# Patient Record
Sex: Female | Born: 1980 | Race: White | Hispanic: No | Marital: Married | Smoking: Never smoker
Health system: Southern US, Community
[De-identification: ages and names within clinical notes are randomized; demographics above are authoritative.]

## PROBLEM LIST (undated history)

## (undated) DIAGNOSIS — J069 Acute upper respiratory infection, unspecified: Secondary | ICD-10-CM

## (undated) DIAGNOSIS — J45909 Unspecified asthma, uncomplicated: Secondary | ICD-10-CM

## (undated) DIAGNOSIS — L508 Other urticaria: Secondary | ICD-10-CM

## (undated) DIAGNOSIS — T7840XA Allergy, unspecified, initial encounter: Secondary | ICD-10-CM

## (undated) DIAGNOSIS — K219 Gastro-esophageal reflux disease without esophagitis: Secondary | ICD-10-CM

## (undated) HISTORY — DX: Allergy, unspecified, initial encounter: T78.40XA

## (undated) HISTORY — PX: MANDIBLE SURGERY: SHX707

## (undated) HISTORY — DX: Unspecified asthma, uncomplicated: J45.909

## (undated) HISTORY — DX: Other urticaria: L50.8

## (undated) HISTORY — DX: Acute upper respiratory infection, unspecified: J06.9

## (undated) HISTORY — PX: CHOLECYSTECTOMY: SHX55

---

## 2003-07-13 ENCOUNTER — Emergency Department (HOSPITAL_COMMUNITY): Admission: EM | Admit: 2003-07-13 | Discharge: 2003-07-14 | Payer: Self-pay | Admitting: *Deleted

## 2003-10-23 ENCOUNTER — Other Ambulatory Visit: Admission: RE | Admit: 2003-10-23 | Discharge: 2003-10-23 | Payer: Self-pay | Admitting: Obstetrics and Gynecology

## 2004-04-14 ENCOUNTER — Inpatient Hospital Stay (HOSPITAL_COMMUNITY): Admission: AD | Admit: 2004-04-14 | Discharge: 2004-04-17 | Payer: Self-pay | Admitting: Obstetrics & Gynecology

## 2012-07-29 ENCOUNTER — Encounter: Payer: Self-pay | Admitting: Nurse Practitioner

## 2012-07-29 ENCOUNTER — Ambulatory Visit (INDEPENDENT_AMBULATORY_CARE_PROVIDER_SITE_OTHER): Payer: BC Managed Care – PPO | Admitting: Nurse Practitioner

## 2012-07-29 VITALS — BP 122/77 | Temp 97.6°F | Wt 218.6 lb

## 2012-07-29 DIAGNOSIS — J069 Acute upper respiratory infection, unspecified: Secondary | ICD-10-CM

## 2012-07-29 MED ORDER — CEFPROZIL 500 MG PO TABS: 500.0000 mg | ORAL_TABLET | Freq: Two times a day (BID) | ORAL | Status: DC

## 2012-07-29 MED ORDER — HYDROCODONE-HOMATROPINE 5-1.5 MG/5ML PO SYRP: 5.0000 mL | ORAL_SOLUTION | ORAL | Status: DC | PRN

## 2012-08-01 ENCOUNTER — Encounter: Payer: Self-pay | Admitting: Nurse Practitioner

## 2012-08-01 NOTE — Progress Notes (Signed)
Subjective:  Presents complaints of sinus symptoms over the past 3 days. Low-grade fever. Frequent nonproductive cough. Facial area headache. Green nasal drainage. Occasional posttussive vomiting. No wheezing. Slight sore throat. No ear pain. Taking fluids well. No relief with OTC meds.  Objective:   BP 122/77  Temp(Src) 97.6 F (36.4 C)  Wt 218 lb 9.6 oz (99.156 kg)  LMP 07/29/2012 NAD. Alert, oriented. TMs clear effusion, no erythema. Pharynx erythematous with green PND noted. Neck supple with mild soft adenopathy. Lungs clear. Heart regular rate rhythm.  Assessment:Acute upper respiratory infection  Plan: Cefzil and hydrocodone as directed. OTC meds for daytime use. Call back next week if no improvement symptoms, sooner if worse.

## 2012-12-15 ENCOUNTER — Telehealth: Payer: Self-pay | Admitting: Family Medicine

## 2012-12-15 ENCOUNTER — Other Ambulatory Visit: Payer: Self-pay | Admitting: *Deleted

## 2012-12-15 MED ORDER — AMOXICILLIN 500 MG PO TABS: 500.0000 mg | ORAL_TABLET | Freq: Three times a day (TID) | ORAL | Status: DC

## 2012-12-15 MED ORDER — FLUCONAZOLE 150 MG PO TABS: 150.0000 mg | ORAL_TABLET | Freq: Once | ORAL | Status: DC

## 2012-12-15 NOTE — Telephone Encounter (Signed)
If not allergic to PCN then amoxil 500 one tid 10 days, f-u if ongoing

## 2012-12-15 NOTE — Telephone Encounter (Signed)
Discussed with patient med sent to pharm. Pt also wanted to know if she could get diflucan bc she also gets yeast infection when taking antibiotic. Pt does not need a call back if you agree to give. Just send to [facility].

## 2012-12-15 NOTE — Telephone Encounter (Signed)
Patients son was diagnosed with strep throat-Patient is now having throat pain, and headache and was told in OV with her son that we could call something in if she showed symptoms.     [facility]

## 2012-12-15 NOTE — Telephone Encounter (Signed)
Error-not son, daughter was diagnosed by Eber Jones. Amy Russell is daughter.

## 2012-12-15 NOTE — Telephone Encounter (Signed)
Send diflucan 150, 1 po times one, 2 refills

## 2012-12-15 NOTE — Telephone Encounter (Signed)
Med sent to pharm 

## 2012-12-27 ENCOUNTER — Encounter: Payer: Self-pay | Admitting: Family Medicine

## 2012-12-27 ENCOUNTER — Ambulatory Visit (INDEPENDENT_AMBULATORY_CARE_PROVIDER_SITE_OTHER): Payer: BC Managed Care – PPO | Admitting: Family Medicine

## 2012-12-27 VITALS — BP 132/88 | Temp 97.9°F | Ht 67.5 in | Wt 220.0 lb

## 2012-12-27 DIAGNOSIS — J019 Acute sinusitis, unspecified: Secondary | ICD-10-CM

## 2012-12-27 MED ORDER — LEVOFLOXACIN 500 MG PO TABS: 500.0000 mg | ORAL_TABLET | Freq: Every day | ORAL | Status: AC

## 2012-12-27 NOTE — Progress Notes (Signed)
  Subjective:    Patient ID: Amy Russell, female    DOB: 1980-04-29, 32 y.o.   MRN: [id number]  Cough This is a new problem. The current episode started yesterday. Associated symptoms include headaches, myalgias, postnasal drip and a sore throat. She has tried OTC cough suppressant (tylenol, mucinex, clariton) for the symptoms.   PMH denies any other particular problems currently does not smoke. No allergies.  Review of Systems  HENT: Positive for postnasal drip and sore throat.   Respiratory: Positive for cough.   Musculoskeletal: Positive for myalgias.  Neurological: Positive for headaches.       Objective:   Physical Exam  Moderate sinus tenderness in the maxillary sinuses eardrums normal throat is normal neck is supple lungs are clear no crackles no wheezing. Respiratory rate normal     Assessment & Plan:  Recent pharyngitis-resolving Viral syndrome with secondary sinusitis-Levaquin 10 days as directed

## 2012-12-27 NOTE — Patient Instructions (Signed)
Pseudoephedrine ask at pharmacy ( for cough use Delsym )

## 2013-02-08 ENCOUNTER — Encounter: Payer: Self-pay | Admitting: Family Medicine

## 2013-02-08 ENCOUNTER — Ambulatory Visit (INDEPENDENT_AMBULATORY_CARE_PROVIDER_SITE_OTHER): Payer: BC Managed Care – PPO | Admitting: Family Medicine

## 2013-02-08 VITALS — BP 136/94 | Temp 97.6°F | Ht 67.0 in | Wt 212.8 lb

## 2013-02-08 DIAGNOSIS — J310 Chronic rhinitis: Secondary | ICD-10-CM

## 2013-02-08 DIAGNOSIS — J329 Chronic sinusitis, unspecified: Secondary | ICD-10-CM

## 2013-02-08 MED ORDER — CEFDINIR 300 MG PO CAPS: 300.0000 mg | ORAL_CAPSULE | Freq: Two times a day (BID) | ORAL | Status: DC

## 2013-02-08 NOTE — Progress Notes (Signed)
   Subjective:    Patient ID: Amy Russell, female    DOB: 24-Mar-1980, 32 y.o.   MRN: [id number]  Sinusitis This is a new problem. The current episode started in the past 7 days. There has been no fever. Associated symptoms include congestion, coughing, headaches, sinus pressure and a sore throat. Past treatments include acetaminophen and oral decongestants. The treatment provided mild relief.   Two  thers with colds  Fall allergies  Cold and sinus med s  Bad drainage dy and night,  Felt bad mond night, and tue  Took off tod with school   Review of Systems  HENT: Positive for congestion, sinus pressure and sore throat.   Respiratory: Positive for cough.   Neurological: Positive for headaches.   no vomiting or diarrhea     Objective:   Physical Exam Alert moderate malaise. H&T moderate nasal congestion frontal tenderness. Pharynx erythema neck supple. Lungs clear heart regular in rhythm.       Assessment & Plan:  Impression 1 acute rhinosinusitis plan Omnicef twice a day 10 days. Symptomatic care discussed. Warning signs discussed. WSL

## 2013-02-11 ENCOUNTER — Encounter: Payer: Self-pay | Admitting: *Deleted

## 2013-03-06 ENCOUNTER — Ambulatory Visit: Payer: BC Managed Care – PPO | Admitting: Family Medicine

## 2013-04-11 ENCOUNTER — Encounter: Payer: Self-pay | Admitting: Family Medicine

## 2013-04-11 ENCOUNTER — Ambulatory Visit (INDEPENDENT_AMBULATORY_CARE_PROVIDER_SITE_OTHER): Payer: BC Managed Care – PPO | Admitting: Family Medicine

## 2013-04-11 VITALS — BP 110/70 | Temp 97.6°F | Ht 67.0 in | Wt 214.5 lb

## 2013-04-11 DIAGNOSIS — J310 Chronic rhinitis: Secondary | ICD-10-CM

## 2013-04-11 DIAGNOSIS — J329 Chronic sinusitis, unspecified: Secondary | ICD-10-CM

## 2013-04-11 MED ORDER — AMOXICILLIN-POT CLAVULANATE 875-125 MG PO TABS: 1.0000 | ORAL_TABLET | Freq: Two times a day (BID) | ORAL | Status: AC

## 2013-04-11 NOTE — Progress Notes (Signed)
   Subjective:    Patient ID: Amy Russell, female    DOB: [birth year], 33 y.o.   MRN: [id number]  Fever  This is a new problem. The current episode started in the past 7 days. The problem occurs constantly. The problem has been gradually worsening. The maximum temperature noted was 101 to 101.9 F. Associated symptoms include congestion, coughing, headaches, muscle aches and a sore throat. She has tried NSAIDs for the symptoms. The treatment provided mild relief.   Felt bad thur eve  Got a little better  Sunday started fever chills  Throat hurtng reeally bad  Fever mon felt horible achey Energy level poor  Appetite diminished  Facial pain and tend  Cough non productive  mostly at nght     Review of Systems  Constitutional: Positive for fever.  HENT: Positive for congestion and sore throat.   Respiratory: Positive for cough.   Neurological: Positive for headaches.       Objective:   Physical Exam  Alert moderate malaise. H&T moderate his congestion frontal tenderness pharynx erythematous. Neck supple. Lungs clear heart rare rhythm.      Assessment & Plan:  Impression 1 rhinosinusitis plan Augmentin twice a day 10 days. Since Medicare discussed. Warning signs discussed. WSL

## 2013-04-17 ENCOUNTER — Telehealth: Payer: Self-pay | Admitting: Family Medicine

## 2013-04-17 MED ORDER — LEVOFLOXACIN 500 MG PO TABS: 500.0000 mg | ORAL_TABLET | Freq: Every day | ORAL | Status: DC

## 2013-04-17 NOTE — Telephone Encounter (Signed)
Patient says that Augmentin that she was prescribed last week is causing her vomiting and diarrhea and makes her feel bad. Please advise.    [facility]

## 2013-04-17 NOTE — Telephone Encounter (Signed)
This can be a side effect. Stop Augmentin, may use Levaquin 500 one qd for 10 days

## 2013-04-17 NOTE — Telephone Encounter (Signed)
Discussed with patient to stop augmentin and to start levaquin. rx sent to [facility].

## 2013-07-25 ENCOUNTER — Encounter: Payer: Self-pay | Admitting: Family Medicine

## 2013-07-25 ENCOUNTER — Ambulatory Visit (INDEPENDENT_AMBULATORY_CARE_PROVIDER_SITE_OTHER): Payer: BC Managed Care – PPO | Admitting: Family Medicine

## 2013-07-25 VITALS — BP 122/74 | Temp 98.5°F | Ht 67.5 in | Wt 216.0 lb

## 2013-07-25 DIAGNOSIS — J029 Acute pharyngitis, unspecified: Secondary | ICD-10-CM

## 2013-07-25 DIAGNOSIS — R509 Fever, unspecified: Secondary | ICD-10-CM

## 2013-07-25 MED ORDER — CEFPROZIL 500 MG PO TABS: 500.0000 mg | ORAL_TABLET | Freq: Two times a day (BID) | ORAL | Status: DC

## 2013-07-25 MED ORDER — CEFTRIAXONE SODIUM 1 G IJ SOLR
500.0000 mg | Freq: Once | INTRAMUSCULAR | Status: AC
  Administered 2013-07-25: 500 mg via INTRAMUSCULAR

## 2013-07-25 NOTE — Progress Notes (Signed)
   Subjective:    Patient ID: Amy Russell, female    DOB: 1980-09-12, 33 y.o.   MRN: [id number]  Fever  This is a new problem. The current episode started yesterday. Pertinent negatives include no chest pain, congestion, coughing, ear pain or wheezing. Associated symptoms comments: Sore throat, bodyaches. She has tried acetaminophen and NSAIDs for the symptoms. The treatment provided no relief.    PMH benign  Review of Systems  Constitutional: Positive for fever. Negative for activity change.  HENT: Positive for sinus pressure. Negative for congestion, ear pain and rhinorrhea.   Eyes: Negative for discharge.  Respiratory: Negative for cough, shortness of breath and wheezing.   Cardiovascular: Negative for chest pain.       Objective:   Physical Exam  Nursing note and vitals reviewed. Constitutional: She appears well-developed.  HENT:  Head: Normocephalic.  Nose: Nose normal.  Mouth/Throat: Oropharyngeal exudate present.  Neck: Neck supple.  Cardiovascular: Normal rate and normal heart sounds.   No murmur heard. Pulmonary/Chest: Effort normal and breath sounds normal. She has no wheezes.  Lymphadenopathy:    She has no cervical adenopathy.  Skin: Skin is warm and dry.          Assessment & Plan:  Probable strep throat given the acute onset of fever adenopathy him feeling bad along with exudative pharyngitis IM shot of antibiotics along with antibiotics and followup if ongoing troubles warnings discussed.

## 2013-08-01 ENCOUNTER — Ambulatory Visit (INDEPENDENT_AMBULATORY_CARE_PROVIDER_SITE_OTHER): Payer: BC Managed Care – PPO | Admitting: Family Medicine

## 2013-08-01 ENCOUNTER — Encounter: Payer: Self-pay | Admitting: Family Medicine

## 2013-08-01 VITALS — BP 114/80 | Temp 98.5°F | Ht 67.5 in | Wt 214.0 lb

## 2013-08-01 DIAGNOSIS — J329 Chronic sinusitis, unspecified: Secondary | ICD-10-CM

## 2013-08-01 DIAGNOSIS — J310 Chronic rhinitis: Secondary | ICD-10-CM

## 2013-08-01 DIAGNOSIS — R05 Cough: Secondary | ICD-10-CM

## 2013-08-01 DIAGNOSIS — R062 Wheezing: Secondary | ICD-10-CM

## 2013-08-01 DIAGNOSIS — R059 Cough, unspecified: Secondary | ICD-10-CM

## 2013-08-01 DIAGNOSIS — J209 Acute bronchitis, unspecified: Secondary | ICD-10-CM

## 2013-08-01 MED ORDER — ALBUTEROL SULFATE HFA 108 (90 BASE) MCG/ACT IN AERS: 2.0000 | INHALATION_SPRAY | Freq: Four times a day (QID) | RESPIRATORY_TRACT | Status: DC | PRN

## 2013-08-01 MED ORDER — AZITHROMYCIN 250 MG PO TABS: ORAL_TABLET | ORAL | Status: DC

## 2013-08-01 MED ORDER — HYDROCODONE-HOMATROPINE 5-1.5 MG/5ML PO SYRP: 5.0000 mL | ORAL_SOLUTION | Freq: Four times a day (QID) | ORAL | Status: DC | PRN

## 2013-08-01 NOTE — Patient Instructions (Signed)
Metered Dose Inhaler (No Spacer Used) Inhaled medicines are the basis of asthma treatment and other breathing problems. Inhaled medicine can only be effective if used properly. Good technique assures that the medicine reaches the lungs. Metered dose inhalers (MDIs) are used to deliver a variety of inhaled medicines. These include quick relief or rescue medicines (such as bronchodilators) and controller medicines (such as corticosteroids). The medicine is delivered by pushing down on a metal canister to release a set amount of spray. If you are using different kinds of inhalers, use your quick relief medicine to open the airways 10 15 minutes before using a steroid if instructed to do so by your health care provider. If you are unsure which inhalers to use and the order of using them, ask your health care provider, nurse, or respiratory therapist. HOW TO USE THE INHALER 1. Remove cap from inhaler. 2. If you are using the inhaler for the first time, you will need to prime it. Shake the inhaler for 5 seconds and release four puffs into the air, away from your face. Ask your health care provider or pharmacist if you have questions about priming your inhaler. 3. Shake inhaler for 5 seconds before each breath in (inhalation). 4. Position the inhaler so that the top of the canister faces up. 5. Put your index finger on the top of the medicine canister. Your thumb supports the bottom of the inhaler. 6. Open your mouth. 7. Either place the inhaler between your teeth and place your lips tightly around the mouthpiece, or hold the inhaler 1 2 inches away from your open mouth. If you are unsure of which technique to use, ask your health care provider. 8. Breathe out (exhale) normally and as completely as possible. 9. Press the canister down with the index finger to release the medicine. 10. At the same time as the canister is pressed, inhale deeply and slowly until the lungs are completely filled. This should take  4 6 seconds. Keep your tongue down. 11. Hold the medicine in your lungs for up to 5 10 seconds (10 seconds is best). This helps the medicine get into the small airways of your lungs. 12. Breathe out slowly, through pursed lips. Whistling is an example of pursed lips. 13. Wait at least 1 minute between puffs. Continue with the above steps until you have taken the number of puffs your health care provider has ordered. Do not use the inhaler more than your health care provider directs you to. 14. Replace cap on inhaler. 15. Follow the directions from your health care provider or the inhaler insert for cleaning the inhaler. If you are using a steroid inhaler, rinse your mouth with water after your last puff, gargle, and spit out the water. Do not swallow the water. AVOID:  Inhaling before or after starting the spray of medicine. It takes practice to coordinate your breathing with triggering the spray.  Inhaling through the nose (rather than the mouth) when triggering the spray. HOW TO DETERMINE IF YOUR INHALER IS FULL OR NEARLY EMPTY You cannot know when an inhaler is empty by shaking it. A few inhalers are now being made with dose counters. Ask your health care provider for a prescription that has a dose counter if you feel you need that extra help. If your inhaler does not have a counter, ask your health care provider to help you determine the date you need to refill your inhaler. Write the refill date on a calendar or your inhaler canister.   Refill your inhaler 7 10 days before it runs out. Be sure to keep an adequate supply of medicine. This includes making sure it is not expired, and you have a spare inhaler.  SEEK MEDICAL CARE IF:   Symptoms are only partially relieved with your inhaler.  You are having trouble using your inhaler.  You experience some increase in phlegm. SEEK IMMEDIATE MEDICAL CARE IF:   You feel little or no relief with your inhalers. You are still wheezing and are feeling  shortness of breath or tightness in your chest or both.  You have dizziness, headaches, or fast heart rate.  You have chills, fever, or night sweats.  There is a noticeable increase in phlegm production, or there is blood in the phlegm. Document Released: 12/21/2006 Document Revised: 10/26/2012 Document Reviewed: 08/11/2012 [employer] Patient Information 2014 [employer].  

## 2013-08-01 NOTE — Progress Notes (Signed)
   Subjective:    Patient ID: Amy Russell, female    DOB: 1980/08/31, 33 y.o.   MRN: [id number]  Wheezing  This is a new problem. Associated symptoms include vomiting. Associated symptoms comments: Cough, fever, sore throat. Treatments tried: cefzil, rocephin, ibuprofen.   PMH benign she was seen about 10 days ago for an infection that got better then this got worse   Review of Systems  Respiratory: Positive for wheezing.   Gastrointestinal: Positive for vomiting.       Objective:   Physical Exam  Lungs are clear tight cough noted not in respiratory distress neck supple anterior lymphadenopathy noted sinus nontender eardrums normal throat normal      Assessment & Plan:  Reactive airway her cough is characteristic of asthmatic cough I don't think the patient has asthma feel it's due to the illness don't recommend prednisone at this time I don't feel she has pneumonia I don't recommend chest x-ray or labs. We will go ahead and do the antibiotics along with cough medication for nighttime use only in albuterol use warning signs were discussed.

## 2013-08-02 ENCOUNTER — Encounter: Payer: Self-pay | Admitting: Family Medicine

## 2013-08-14 ENCOUNTER — Encounter: Payer: Self-pay | Admitting: Family Medicine

## 2013-08-14 ENCOUNTER — Ambulatory Visit (INDEPENDENT_AMBULATORY_CARE_PROVIDER_SITE_OTHER): Payer: BC Managed Care – PPO | Admitting: Family Medicine

## 2013-08-14 VITALS — BP 110/84 | Temp 97.8°F | Ht 67.5 in | Wt 212.2 lb

## 2013-08-14 DIAGNOSIS — R059 Cough, unspecified: Secondary | ICD-10-CM

## 2013-08-14 DIAGNOSIS — J45909 Unspecified asthma, uncomplicated: Secondary | ICD-10-CM

## 2013-08-14 DIAGNOSIS — R05 Cough: Secondary | ICD-10-CM

## 2013-08-14 MED ORDER — LEVOFLOXACIN 500 MG PO TABS: 500.0000 mg | ORAL_TABLET | Freq: Every day | ORAL | Status: DC

## 2013-08-14 MED ORDER — HYDROCODONE-HOMATROPINE 5-1.5 MG/5ML PO SYRP: 5.0000 mL | ORAL_SOLUTION | Freq: Four times a day (QID) | ORAL | Status: DC | PRN

## 2013-08-14 MED ORDER — PREDNISONE 20 MG PO TABS: ORAL_TABLET | ORAL | Status: DC

## 2013-08-14 NOTE — Progress Notes (Signed)
   Subjective:    Patient ID: Amy Russell, female    DOB: [birth year], 33 y.o.   MRN: [id number]  Cough This is a new problem. The current episode started 1 to 4 weeks ago. The problem has been unchanged. Associated symptoms include chest pain, shortness of breath and wheezing. Nothing aggravates the symptoms. She has tried prescription cough suppressant (zpak, hycodan) for the symptoms. The treatment provided mild relief.   Patient states she has no other concerns at this time.   Patient states she just not getting better the way she thought she should Mother has a history of asthma Review of Systems  Respiratory: Positive for cough, shortness of breath and wheezing.   Cardiovascular: Positive for chest pain.       Objective:   Physical Exam Bilateral knees is noted along with some congestion in the lungs not respiratory distress heart regular pulse normal extremities no edema skin warm dry neurologic grossly normal       Assessment & Plan:  Pulmonary infection along with reactive airway albuterol on as-needed basis prednisone taper along with the change in antibiotics chest x-ray recommended await the results if patient does not improve over the next 10 days consider pulmonary function tests as well as steroid inhaler

## 2013-08-15 ENCOUNTER — Ambulatory Visit (HOSPITAL_COMMUNITY)
Admission: RE | Admit: 2013-08-15 | Discharge: 2013-08-15 | Disposition: A | Discharge status: Home / Self Care | Payer: BC Managed Care – PPO | Source: Ambulatory Visit | Attending: Family Medicine | Admitting: Family Medicine

## 2013-08-15 DIAGNOSIS — R059 Cough, unspecified: Secondary | ICD-10-CM | POA: Insufficient documentation

## 2013-08-15 DIAGNOSIS — R05 Cough: Secondary | ICD-10-CM | POA: Insufficient documentation

## 2013-08-22 ENCOUNTER — Other Ambulatory Visit: Payer: Self-pay | Admitting: Family Medicine

## 2013-08-22 MED ORDER — BECLOMETHASONE DIPROPIONATE 80 MCG/ACT IN AERS: 2.0000 | INHALATION_SPRAY | Freq: Two times a day (BID) | RESPIRATORY_TRACT | Status: DC

## 2013-08-22 NOTE — Progress Notes (Signed)
To use steroid inhaler bid for 4 weeks then if doing well can stop, call if ongoing, then will need PFT

## 2013-10-25 ENCOUNTER — Ambulatory Visit (INDEPENDENT_AMBULATORY_CARE_PROVIDER_SITE_OTHER): Payer: BC Managed Care – PPO | Admitting: Family Medicine

## 2013-10-25 ENCOUNTER — Encounter: Payer: Self-pay | Admitting: Family Medicine

## 2013-10-25 VITALS — BP 122/78 | Ht 67.5 in | Wt 221.0 lb

## 2013-10-25 DIAGNOSIS — R509 Fever, unspecified: Secondary | ICD-10-CM

## 2013-10-25 DIAGNOSIS — J029 Acute pharyngitis, unspecified: Secondary | ICD-10-CM

## 2013-10-25 LAB — POCT RAPID STREP A (OFFICE): RAPID STREP A SCREEN: POSITIVE — AB

## 2013-10-25 MED ORDER — TRAMADOL HCL 50 MG PO TABS: 50.0000 mg | ORAL_TABLET | Freq: Four times a day (QID) | ORAL | Status: DC | PRN

## 2013-10-25 MED ORDER — CEFTRIAXONE SODIUM 1 G IJ SOLR
1.0000 g | Freq: Once | INTRAMUSCULAR | Status: AC
  Administered 2013-10-25: 1 g via INTRAMUSCULAR

## 2013-10-25 MED ORDER — AMOXICILLIN 500 MG PO TABS: 500.0000 mg | ORAL_TABLET | Freq: Three times a day (TID) | ORAL | Status: DC

## 2013-10-25 NOTE — Progress Notes (Signed)
   Subjective:    Patient ID: Amy Russell, female    DOB: Apr 19, 1980, 33 y.o.   MRN: [id number]  Sore Throat  This is a new problem. The current episode started yesterday. Associated symptoms include headaches. Associated symptoms comments: Fever, body aches. She has tried acetaminophen (tylenol) for the symptoms.    Has had frequent strep throat in the past  Review of Systems  Neurological: Positive for headaches.       Objective:   Physical Exam Lungs are clear heart is regular neck has enlarged lymph nodes anterior aspect eardrums normal throat irritation consistent with exudate   Rapid strep positive    Assessment & Plan:     Febrile Illness Strep throat Rocephin shot along with antibiotics. Warning signs discussed followup if problems

## 2014-03-05 ENCOUNTER — Encounter: Payer: Self-pay | Admitting: Family Medicine

## 2014-03-05 ENCOUNTER — Ambulatory Visit (INDEPENDENT_AMBULATORY_CARE_PROVIDER_SITE_OTHER): Payer: BC Managed Care – PPO | Admitting: Family Medicine

## 2014-03-05 VITALS — BP 122/76 | Temp 98.1°F | Ht 67.5 in | Wt 223.0 lb

## 2014-03-05 DIAGNOSIS — J019 Acute sinusitis, unspecified: Secondary | ICD-10-CM

## 2014-03-05 DIAGNOSIS — B9689 Other specified bacterial agents as the cause of diseases classified elsewhere: Secondary | ICD-10-CM

## 2014-03-05 MED ORDER — LEVOFLOXACIN 500 MG PO TABS: 500.0000 mg | ORAL_TABLET | Freq: Every day | ORAL | Status: DC

## 2014-03-05 NOTE — Progress Notes (Signed)
   Subjective:    Patient ID: Amy Russell, female    DOB: [birth year], 33 y.o.   MRN: [id number]  Cough This is a new problem. The current episode started in the past 7 days. Associated symptoms include chills, ear pain, headaches, myalgias, rhinorrhea and a sore throat. Pertinent negatives include no chest pain, fever, shortness of breath or wheezing. Treatments tried: tylenol, sinus med, delsym.   PMH benign   Review of Systems  Constitutional: Positive for chills. Negative for fever and activity change.  HENT: Positive for congestion, ear pain, rhinorrhea and sore throat.   Eyes: Negative for discharge.  Respiratory: Positive for cough. Negative for shortness of breath and wheezing.   Cardiovascular: Negative for chest pain.  Musculoskeletal: Positive for myalgias.  Neurological: Positive for headaches.       Objective:   Physical Exam  Constitutional: She appears well-developed.  HENT:  Head: Normocephalic.  Nose: Nose normal.  Mouth/Throat: Oropharynx is clear and moist. No oropharyngeal exudate.  Neck: Neck supple.  Cardiovascular: Normal rate and normal heart sounds.   No murmur heard. Pulmonary/Chest: Effort normal and breath sounds normal. She has no wheezes.  Lymphadenopathy:    She has no cervical adenopathy.  Skin: Skin is warm and dry.  Nursing note and vitals reviewed.         Assessment & Plan:  Viral syndrome Acute rhinosinusitis Warning signs discuss Antibiotics prescribed.

## 2014-03-07 ENCOUNTER — Ambulatory Visit (INDEPENDENT_AMBULATORY_CARE_PROVIDER_SITE_OTHER): Payer: BC Managed Care – PPO

## 2014-03-07 DIAGNOSIS — H6120 Impacted cerumen, unspecified ear: Secondary | ICD-10-CM

## 2014-03-07 NOTE — Progress Notes (Signed)
   Subjective:    Patient ID: Amy Russell, female    DOB: [birth year], 33 y.o.   MRN: [id number]  HPI Bilateral ear irrigation completed without difficulty.    Review of Systems     Objective:   Physical Exam        Assessment & Plan:

## 2014-04-30 ENCOUNTER — Ambulatory Visit (INDEPENDENT_AMBULATORY_CARE_PROVIDER_SITE_OTHER): Payer: BC Managed Care – PPO | Admitting: Family Medicine

## 2014-04-30 ENCOUNTER — Encounter: Payer: Self-pay | Admitting: Family Medicine

## 2014-04-30 VITALS — BP 110/72 | Temp 97.5°F | Ht 67.5 in | Wt 226.0 lb

## 2014-04-30 DIAGNOSIS — N390 Urinary tract infection, site not specified: Secondary | ICD-10-CM

## 2014-04-30 DIAGNOSIS — R300 Dysuria: Secondary | ICD-10-CM

## 2014-04-30 LAB — POCT URINALYSIS DIPSTICK
PH UA: 5
Spec Grav, UA: 1.015

## 2014-04-30 MED ORDER — CIPROFLOXACIN HCL 500 MG PO TABS: 500.0000 mg | ORAL_TABLET | Freq: Two times a day (BID) | ORAL | Status: DC

## 2014-04-30 MED ORDER — PHENAZOPYRIDINE HCL 200 MG PO TABS: 200.0000 mg | ORAL_TABLET | Freq: Three times a day (TID) | ORAL | Status: DC | PRN

## 2014-04-30 NOTE — Progress Notes (Signed)
   Subjective:    Patient ID: Amy Russell, female    DOB: [birth year], 34 y.o.   MRN: [id number]  Urinary Tract Infection  This is a new problem. The current episode started in the past 7 days. Associated symptoms comments: abd pain. She has tried NSAIDs and increased fluids for the symptoms.   Patient had a severe UTI back when she was in high school that resulted in pyelonephritis and resulted in being hospitalized. Patient did not want to go through that again   Review of Systems She denies any high fever. She does relate mild back pain on the right side. She does relate dysuria and frequency no nausea vomiting or diarrhea.    Objective:   Physical Exam  Lungs clear heart regular flank nontender lower abdomen moderate tenderness Urinalysis with wbc's      Assessment & Plan:  Urine culture UTI Antibiotics Cipro 10 days, take at least 7 days twice a day Pyridium when necessary Warning signs discussed.

## 2014-05-03 ENCOUNTER — Telehealth: Payer: Self-pay | Admitting: Family Medicine

## 2014-05-03 LAB — URINE CULTURE

## 2014-05-03 NOTE — Telephone Encounter (Signed)
Seen 04/30/14 prescribed Cipro 500 mg BID x 10 days and pyridium

## 2014-05-03 NOTE — Telephone Encounter (Signed)
Normal i cked cx and sens to current abx cst

## 2014-05-03 NOTE — Telephone Encounter (Signed)
Notified patient normal Dr. Brett CanalesSteve checked cx and sensitive to current abx cst. Patient verbalized understanding.

## 2014-05-03 NOTE — Telephone Encounter (Signed)
Pt called stating that she was seen a couple days ago and she is Still having lower back pain. She states that she has been taking the Medicine for her uti plus the antibiotics and is wanting to know if that Is normal or if she needs to be seen again.

## 2014-05-03 NOTE — Telephone Encounter (Signed)
Pt wondering does she need more meds.  She has one day dose left and she's still having some pain with urination.  Worried with the weekend coming if she'll need more.  Should we refill phenazopyridine (PYRIDIUM) 200 MG tablet   Please call pt with suggestions.  [facility]

## 2014-05-04 MED ORDER — PHENAZOPYRIDINE HCL 200 MG PO TABS: 200.0000 mg | ORAL_TABLET | Freq: Three times a day (TID) | ORAL | Status: DC | PRN

## 2014-05-04 NOTE — Telephone Encounter (Signed)
Refill pyridium but take all 10 days of Cipro

## 2014-05-04 NOTE — Telephone Encounter (Signed)
Med refilled. Pt notified and verbalized understanding.

## 2014-06-26 ENCOUNTER — Encounter: Payer: Self-pay | Admitting: Family Medicine

## 2014-06-26 ENCOUNTER — Ambulatory Visit (INDEPENDENT_AMBULATORY_CARE_PROVIDER_SITE_OTHER): Payer: BC Managed Care – PPO | Admitting: Family Medicine

## 2014-06-26 VITALS — BP 120/80 | Temp 97.9°F | Ht 67.5 in | Wt 223.0 lb

## 2014-06-26 DIAGNOSIS — J019 Acute sinusitis, unspecified: Secondary | ICD-10-CM | POA: Diagnosis not present

## 2014-06-26 DIAGNOSIS — B9689 Other specified bacterial agents as the cause of diseases classified elsewhere: Secondary | ICD-10-CM

## 2014-06-26 MED ORDER — CEFDINIR 300 MG PO CAPS: 300.0000 mg | ORAL_CAPSULE | Freq: Two times a day (BID) | ORAL | Status: DC

## 2014-06-26 NOTE — Progress Notes (Signed)
   Subjective:    Patient ID: Amy Russell, female    DOB: [birth year], 34 y.o.   MRN: [id number]  Cough This is a new problem. The current episode started in the past 7 days. The problem has been gradually worsening. The problem occurs constantly. The cough is productive of sputum. Associated symptoms include chills, a fever, nasal congestion and a sore throat. Associated symptoms comments: Body aches. Nothing aggravates the symptoms. She has tried OTC cough suppressant (ibuprofen, tylenol) for the symptoms. The treatment provided no relief.   Patient states that she has no other concerns at this time.   Cold cchilly and achey   Allergies for a week before  Sun night, felt bad, no energy , hrrible..cough bad  Wheezing flared up badly   Ached bad and take sthree ibu a needdd  No sig gnky stuff u today  Most all of the head, now geen disch   102.2  Using inhaler Review of Systems  Constitutional: Positive for fever and chills.  HENT: Positive for sore throat.   Respiratory: Positive for cough.        Objective:   Physical Exam  Alert moderate malaise positive nasal discharge frontal tenderness pharynx normal neck supple lungs clear heart regular in rhythm.      Assessment & Plan:  Impression post viral rhinosinusitis plan antibiotics prescribed. Symptomatic care discussed warning signs discussed WSL

## 2014-07-19 ENCOUNTER — Telehealth: Payer: Self-pay | Admitting: Family Medicine

## 2014-07-19 MED ORDER — AZITHROMYCIN 250 MG PO TABS: ORAL_TABLET | ORAL | Status: DC

## 2014-07-19 NOTE — Telephone Encounter (Signed)
Rx printed and up front for patient pick up. Patient notified and will pick up tomm pm

## 2014-07-19 NOTE — Telephone Encounter (Signed)
Per Dr Lorin PicketScott:   Marney SettingZpack use as directed follow up if ongoing

## 2014-07-19 NOTE — Telephone Encounter (Signed)
Pt seen 4/19 diagnosed with the flu  Was told that if she cough persisted or got worse  Once she got passed the flu to call in an you would prescribe her something     Hand written so patient can take to pharm of choice

## 2014-12-17 ENCOUNTER — Ambulatory Visit (INDEPENDENT_AMBULATORY_CARE_PROVIDER_SITE_OTHER): Payer: BC Managed Care – PPO | Admitting: Family Medicine

## 2014-12-17 ENCOUNTER — Encounter: Payer: Self-pay | Admitting: Family Medicine

## 2014-12-17 VITALS — BP 118/80 | Temp 98.6°F | Ht 67.5 in | Wt 232.4 lb

## 2014-12-17 DIAGNOSIS — B9689 Other specified bacterial agents as the cause of diseases classified elsewhere: Secondary | ICD-10-CM

## 2014-12-17 DIAGNOSIS — J019 Acute sinusitis, unspecified: Secondary | ICD-10-CM | POA: Diagnosis not present

## 2014-12-17 MED ORDER — CEFDINIR 300 MG PO CAPS: ORAL_CAPSULE | ORAL | Status: DC

## 2014-12-17 NOTE — Progress Notes (Signed)
   Subjective:    Patient ID: Amy Russell, female    DOB: 1980-07-12, 34 y.o.   MRN: [id number]  Sore Throat  This is a new problem. The current episode started in the past 7 days. Associated symptoms include coughing and headaches. She has tried NSAIDs (Ibuprofe, OTC sinus medication) for the symptoms.  didn't feel the best wed   Bad throat pain  Bad cough  h a worse int he cheeks, stuffy   Throat clearing and morn sor e throat  No fever   ibu pn, sinus h a med  Delsym for cough  Patient states no other concerns this visit.  Review of Systems  Respiratory: Positive for cough.   Neurological: Positive for headaches.   no vomiting or diarrhea     Objective:   Physical Exam  Alert mild malaise. Vital stable frontal max returns drinks erythematous neck supple. Lungs clear. Heart regular in rhythm.      Assessment & Plan:  Impression post viral rhinosinusitis plan antibiotics prescribed. Symptomatic care discussed warning signs discussed WSL

## 2015-03-21 ENCOUNTER — Encounter: Payer: Self-pay | Admitting: Family Medicine

## 2015-03-21 ENCOUNTER — Other Ambulatory Visit (HOSPITAL_COMMUNITY)
Admission: RE | Admit: 2015-03-21 | Discharge: 2015-03-21 | Disposition: A | Discharge status: Home / Self Care | Payer: BC Managed Care – PPO | Source: Other Acute Inpatient Hospital | Attending: Family Medicine | Admitting: Family Medicine

## 2015-03-21 ENCOUNTER — Ambulatory Visit (INDEPENDENT_AMBULATORY_CARE_PROVIDER_SITE_OTHER): Payer: BC Managed Care – PPO | Admitting: Family Medicine

## 2015-03-21 VITALS — BP 124/80 | Temp 98.4°F | Ht 67.5 in | Wt 234.0 lb

## 2015-03-21 DIAGNOSIS — N3000 Acute cystitis without hematuria: Secondary | ICD-10-CM | POA: Diagnosis not present

## 2015-03-21 DIAGNOSIS — R300 Dysuria: Secondary | ICD-10-CM | POA: Insufficient documentation

## 2015-03-21 MED ORDER — LEVOFLOXACIN 500 MG PO TABS: 500.0000 mg | ORAL_TABLET | Freq: Every day | ORAL | Status: DC

## 2015-03-21 MED ORDER — PHENAZOPYRIDINE HCL 200 MG PO TABS: 200.0000 mg | ORAL_TABLET | Freq: Three times a day (TID) | ORAL | Status: DC | PRN

## 2015-03-21 NOTE — Progress Notes (Signed)
   Subjective:    Patient ID: Amy Russell, female    DOB: 24-Jan-1981, 35 y.o.   MRN: [id number]  Urinary Tract Infection  This is a new problem. The current episode started yesterday. The problem occurs every urination. The problem has been unchanged. The quality of the pain is described as burning. The pain is moderate. There has been no fever. Associated symptoms include nausea. Associated symptoms comments: Back pain . She has tried NSAIDs (AZO) for the symptoms. The treatment provided no relief.   PMH benign   Review of Systems  Gastrointestinal: Positive for nausea.   dysuria urinary from C denies high fever chills     Objective:   Physical Exam  No flank pain. Abdomen minimal lower abdominal tenderness lungs clear heart regular UA with WBCs      Assessment & Plan:  Urine culture was collected brought to the hospital they will send through lab core Urinary tract infection Warning signs discussed Lab Levaquin 10 days as directed.

## 2015-03-23 LAB — URINE CULTURE: Culture: >=100000

## 2015-10-18 ENCOUNTER — Other Ambulatory Visit: Payer: Self-pay | Admitting: Obstetrics

## 2015-10-21 LAB — CYTOLOGY - PAP

## 2015-11-25 ENCOUNTER — Encounter: Payer: Self-pay | Admitting: Family Medicine

## 2015-11-25 ENCOUNTER — Ambulatory Visit (INDEPENDENT_AMBULATORY_CARE_PROVIDER_SITE_OTHER): Payer: BC Managed Care – PPO | Admitting: Family Medicine

## 2015-11-25 VITALS — BP 112/72 | Temp 97.4°F | Ht 67.5 in | Wt 235.0 lb

## 2015-11-25 DIAGNOSIS — J019 Acute sinusitis, unspecified: Secondary | ICD-10-CM

## 2015-11-25 DIAGNOSIS — B9689 Other specified bacterial agents as the cause of diseases classified elsewhere: Secondary | ICD-10-CM

## 2015-11-25 DIAGNOSIS — J4520 Mild intermittent asthma, uncomplicated: Secondary | ICD-10-CM

## 2015-11-25 DIAGNOSIS — J45909 Unspecified asthma, uncomplicated: Secondary | ICD-10-CM | POA: Insufficient documentation

## 2015-11-25 MED ORDER — HYDROCODONE-HOMATROPINE 5-1.5 MG/5ML PO SYRP: 5.0000 mL | ORAL_SOLUTION | Freq: Every evening | ORAL | 0 refills | Status: DC | PRN

## 2015-11-25 MED ORDER — ALBUTEROL SULFATE HFA 108 (90 BASE) MCG/ACT IN AERS: 2.0000 | INHALATION_SPRAY | Freq: Four times a day (QID) | RESPIRATORY_TRACT | 2 refills | Status: DC | PRN

## 2015-11-25 MED ORDER — CEFPROZIL 500 MG PO TABS: 500.0000 mg | ORAL_TABLET | Freq: Two times a day (BID) | ORAL | 0 refills | Status: DC

## 2015-11-25 MED ORDER — PREDNISONE 20 MG PO TABS: ORAL_TABLET | ORAL | 0 refills | Status: DC

## 2015-11-25 MED ORDER — BECLOMETHASONE DIPROPIONATE 80 MCG/ACT IN AERS: 2.0000 | INHALATION_SPRAY | Freq: Two times a day (BID) | RESPIRATORY_TRACT | 3 refills | Status: DC

## 2015-11-25 MED ORDER — FLUCONAZOLE 150 MG PO TABS: 150.0000 mg | ORAL_TABLET | Freq: Once | ORAL | 3 refills | Status: AC

## 2015-11-25 NOTE — Progress Notes (Signed)
   Subjective:    Patient ID: Amy Russell, female    DOB: 1980/09/18, 35 y.o.   MRN: [id number]  Cough  This is a new problem. The current episode started in the past 7 days. The problem has been unchanged. The cough is non-productive. Associated symptoms include chills, myalgias and a sore throat. Associated symptoms comments: Vomiting, chills. Nothing aggravates the symptoms. She has tried OTC cough suppressant (Nyquil, ibuprofen, Afrin ) for the symptoms. The treatment provided no relief.   PMH reactive airway   Review of Systems  Constitutional: Positive for chills.  HENT: Positive for sore throat.   Respiratory: Positive for cough.   Musculoskeletal: Positive for myalgias.       Objective:   Physical Exam  Constitutional: She appears well-developed.  HENT:  Head: Normocephalic.  Nose: Nose normal.  Mouth/Throat: Oropharynx is clear and moist. No oropharyngeal exudate.  Neck: Neck supple.  Cardiovascular: Normal rate and normal heart sounds.   No murmur heard. Pulmonary/Chest: Effort normal and breath sounds normal. She has no wheezes.  Lymphadenopathy:    She has no cervical adenopathy.  Skin: Skin is warm and dry.  Nursing note and vitals reviewed.         Assessment & Plan:  Viral syndrome Reactive airway Rhinosinusitis Acute bronchitis Antibiotics prescribed inhalers prescribed warning signs discussed prednisone prescribing case patient gets worse Hycodan for nighttime use to help with cough caution drowsiness-not for long-term use

## 2015-12-06 ENCOUNTER — Telehealth: Payer: Self-pay | Admitting: Family Medicine

## 2015-12-06 MED ORDER — AMOXICILLIN-POT CLAVULANATE 875-125 MG PO TABS: ORAL_TABLET | ORAL | 0 refills | Status: DC

## 2015-12-06 NOTE — Telephone Encounter (Signed)
Aug 875 bid ten d 

## 2015-12-06 NOTE — Telephone Encounter (Signed)
Spoke with patient and informed her per Dr.Steve Luking- we are sending in prescription for Augmentin 875 take 1 tablet twice a day by mouth for 10 days to [facility]. Patient verbalized understanding.

## 2015-12-06 NOTE — Telephone Encounter (Signed)
Spoke with patient and patient has c/o sinus pressure, headache, cough and runny nose. She recently started on prednisone this Sunday and is no better. Please advise?

## 2015-12-06 NOTE — Telephone Encounter (Signed)
Pt was seen last week and was given an antibiotic. Pt states that she has finished the medication and is not feeling better. Can something else be called in? Please advise.    [facility]

## 2016-01-15 ENCOUNTER — Encounter: Payer: Self-pay | Admitting: Family Medicine

## 2016-01-15 ENCOUNTER — Ambulatory Visit (INDEPENDENT_AMBULATORY_CARE_PROVIDER_SITE_OTHER): Payer: BC Managed Care – PPO | Admitting: Family Medicine

## 2016-01-15 VITALS — BP 130/92 | Temp 97.9°F | Ht 67.5 in | Wt 231.4 lb

## 2016-01-15 DIAGNOSIS — B338 Other specified viral diseases: Secondary | ICD-10-CM

## 2016-01-15 DIAGNOSIS — J019 Acute sinusitis, unspecified: Secondary | ICD-10-CM | POA: Diagnosis not present

## 2016-01-15 DIAGNOSIS — B9689 Other specified bacterial agents as the cause of diseases classified elsewhere: Secondary | ICD-10-CM

## 2016-01-15 DIAGNOSIS — B348 Other viral infections of unspecified site: Secondary | ICD-10-CM

## 2016-01-15 MED ORDER — HYDROCODONE-HOMATROPINE 5-1.5 MG/5ML PO SYRP: 5.0000 mL | ORAL_SOLUTION | Freq: Every evening | ORAL | 0 refills | Status: DC | PRN

## 2016-01-15 MED ORDER — AMOXICILLIN-POT CLAVULANATE 875-125 MG PO TABS: 1.0000 | ORAL_TABLET | Freq: Two times a day (BID) | ORAL | 0 refills | Status: DC

## 2016-01-15 NOTE — Progress Notes (Signed)
   Subjective:    Patient ID: Amy Russell, female    DOB: 08-Sep-1980, 35 y.o.   MRN: [id number]  Influenza  Associated symptoms include chills, congestion, coughing, a fever, headaches, myalgias and a sore throat. Treatments tried: Delysum, Afrin nasal spray, Sinus decongestant, Tylenol, Motrin, Ibuprofen.   Patient states no other concerns this visit. Patient relates body aches fever chills symptoms aren't past couple days worse over the past 2448 hrs. now with sinus pressure pain discomfort.  Review of Systems  Constitutional: Positive for chills and fever.  HENT: Positive for congestion and sore throat.   Respiratory: Positive for cough.   Musculoskeletal: Positive for myalgias.  Neurological: Positive for headaches.       Objective:   Physical Exam Mild sinus tenderness ears normal neck no masses lungs clear heart regular       Assessment & Plan:  Viral syndrome Probable parainfluenza More than likely secondary rhinosinusitis Antibodies prescribed warning signs discuss Blood pressure borderline patient needs to stay away from decongestants

## 2016-02-26 ENCOUNTER — Encounter: Payer: Self-pay | Admitting: Family Medicine

## 2016-02-26 ENCOUNTER — Ambulatory Visit (INDEPENDENT_AMBULATORY_CARE_PROVIDER_SITE_OTHER): Payer: BC Managed Care – PPO | Admitting: Family Medicine

## 2016-02-26 VITALS — BP 116/86 | Temp 98.2°F | Ht 67.5 in | Wt 233.0 lb

## 2016-02-26 DIAGNOSIS — J019 Acute sinusitis, unspecified: Secondary | ICD-10-CM | POA: Diagnosis not present

## 2016-02-26 DIAGNOSIS — B9689 Other specified bacterial agents as the cause of diseases classified elsewhere: Secondary | ICD-10-CM | POA: Diagnosis not present

## 2016-02-26 MED ORDER — CLARITHROMYCIN 500 MG PO TABS: 500.0000 mg | ORAL_TABLET | Freq: Two times a day (BID) | ORAL | 0 refills | Status: AC

## 2016-02-26 MED ORDER — CLARITHROMYCIN 500 MG PO TABS: 500.0000 mg | ORAL_TABLET | Freq: Two times a day (BID) | ORAL | 0 refills | Status: DC

## 2016-02-26 NOTE — Progress Notes (Signed)
   Subjective:    Patient ID: Amy Russell, female    DOB: Jan 19, 1981, 35 y.o.   MRN: [id number]  Sinusitis  This is a new problem. Episode onset: 3 days. Associated symptoms include coughing, ear pain, headaches and a sore throat. (Fever) Treatments tried: nyquil, dayquil, ibuprofen, delsym.   Pt this weekend stated feeling bad,  Throat hurting did not feel well  Bad cough, felt worse  Pt did not have flu shot   Low gr fevers 101 on Monday   Energy level bad  appetie ac  Has used qvar faithfully   Added albutero yest and today    achey all over    vom fr the cough it is so bad    Review of Systems  HENT: Positive for ear pain and sore throat.   Respiratory: Positive for cough.   Neurological: Positive for headaches.       Objective:   Physical Exam  Alert, mild malaise. Hydration good Vitals stable. frontal/ maxillary tenderness evident positive nasal congestion. pharynx normal neck supple  lungs clear/no crackles or wheezes. heart regular in rhythm       Assessment & Plan:  Impression rhinosinusitis likely post viral, discussed with patient. plan antibiotics prescribed. Questions answered. Symptomatic care discussed. warning signs discussed. WSL

## 2016-07-10 ENCOUNTER — Telehealth: Payer: Self-pay | Admitting: Family Medicine

## 2016-07-10 MED ORDER — AMOXICILLIN 500 MG PO CAPS: 500.0000 mg | ORAL_CAPSULE | Freq: Three times a day (TID) | ORAL | 0 refills | Status: DC

## 2016-07-10 NOTE — Telephone Encounter (Signed)
Patient is requesting antibiotic called in for sore throat.Her son Ivin Bootyjoshua was seen yesterday with the same symptoms. [facility].

## 2016-07-10 NOTE — Telephone Encounter (Signed)
Son treated for strep throat yesterday

## 2016-07-10 NOTE — Telephone Encounter (Signed)
Amoxil 500 mg 1 3 times a day 10 days

## 2016-07-10 NOTE — Telephone Encounter (Signed)
Left message informing patient medication sent into the pharmacy.

## 2016-08-10 ENCOUNTER — Encounter: Payer: Self-pay | Admitting: Family Medicine

## 2016-08-10 ENCOUNTER — Ambulatory Visit (INDEPENDENT_AMBULATORY_CARE_PROVIDER_SITE_OTHER): Payer: BC Managed Care – PPO | Admitting: Nurse Practitioner

## 2016-08-10 VITALS — BP 124/88 | Temp 97.7°F | Ht 67.5 in | Wt 225.0 lb

## 2016-08-10 DIAGNOSIS — J069 Acute upper respiratory infection, unspecified: Secondary | ICD-10-CM

## 2016-08-10 DIAGNOSIS — J4521 Mild intermittent asthma with (acute) exacerbation: Secondary | ICD-10-CM | POA: Diagnosis not present

## 2016-08-10 DIAGNOSIS — B9689 Other specified bacterial agents as the cause of diseases classified elsewhere: Secondary | ICD-10-CM | POA: Diagnosis not present

## 2016-08-10 MED ORDER — AMOXICILLIN-POT CLAVULANATE 875-125 MG PO TABS: 1.0000 | ORAL_TABLET | Freq: Two times a day (BID) | ORAL | 0 refills | Status: DC

## 2016-08-10 MED ORDER — ALBUTEROL SULFATE HFA 108 (90 BASE) MCG/ACT IN AERS: 2.0000 | INHALATION_SPRAY | Freq: Four times a day (QID) | RESPIRATORY_TRACT | 2 refills | Status: DC | PRN

## 2016-08-10 MED ORDER — HYDROCODONE-HOMATROPINE 5-1.5 MG/5ML PO SYRP: 5.0000 mL | ORAL_SOLUTION | ORAL | 0 refills | Status: DC | PRN

## 2016-08-10 MED ORDER — PREDNISONE 20 MG PO TABS: ORAL_TABLET | ORAL | 0 refills | Status: DC

## 2016-08-10 NOTE — Progress Notes (Signed)
   Subjective:    Patient ID: Amy Russell, female    DOB: [birth year], 36 y.o.   MRN: [id number]  Sinusitis  This is a new problem. The current episode started in the past 7 days. Associated symptoms include coughing, headaches and a sore throat.    States no other concerns this visit.   Review of Systems  HENT: Positive for sore throat.   Respiratory: Positive for cough.   Neurological: Positive for headaches.  chills. Facial area headache. Occasional cough producing green mucus. Slight wheeze at times. Does not have albuterol inhaler. Has been back on QVAR x 1 month. Cough much worse at night. No relief with OTC meds.      Objective:   Physical Exam NAD. Alert, oriented. TMs retracted, no erythema. Pharynx injected with green PND noted. Neck supple with mild anterior adenopathy. Lungs clear. Heart RRR.        Assessment & Plan:  Bacterial upper respiratory infection  Mild intermittent reactive airway disease with acute exacerbation  Meds ordered this encounter  Medications  . albuterol (PROVENTIL HFA;VENTOLIN HFA) 108 (90 Base) MCG/ACT inhaler    Sig: Inhale 2 puffs into the lungs every 6 (six) hours as needed for wheezing.    Dispense:  1 Inhaler    Refill:  2    Order Specific Question:   Supervising Provider    Answer:   Merlyn AlbertLUKING, WILLIAM S [[id number]]  . HYDROcodone-homatropine (HYCODAN) 5-1.5 MG/5ML syrup    Sig: Take 5 mLs by mouth every 4 (four) hours as needed.    Dispense:  90 mL    Refill:  0    Order Specific Question:   Supervising Provider    Answer:   Merlyn AlbertLUKING, WILLIAM S [[id number]]  . amoxicillin-clavulanate (AUGMENTIN) 875-125 MG tablet    Sig: Take 1 tablet by mouth 2 (two) times daily.    Dispense:  20 tablet    Refill:  0    Order Specific Question:   Supervising Provider    Answer:   Merlyn AlbertLUKING, WILLIAM S [[id number]]  . predniSONE (DELTASONE) 20 MG tablet    Sig: 3 po qd x 3 d then 2 po qd x 3 d then 1 po qd x 2 d    Dispense:  17 tablet    Refill:  0    Order  Specific Question:   Supervising Provider    Answer:   Riccardo DubinLUKING, WILLIAM S [[id number]]   Given written rx for Prednisone; start if no improvement over the next 2-3 days. Call back by end of the week if no better, sooner if worse.

## 2016-08-24 ENCOUNTER — Encounter: Payer: Self-pay | Admitting: Family Medicine

## 2016-08-24 ENCOUNTER — Ambulatory Visit (INDEPENDENT_AMBULATORY_CARE_PROVIDER_SITE_OTHER): Payer: BC Managed Care – PPO | Admitting: Family Medicine

## 2016-08-24 VITALS — BP 120/90 | Ht 67.5 in | Wt 226.1 lb

## 2016-08-24 DIAGNOSIS — M5481 Occipital neuralgia: Secondary | ICD-10-CM | POA: Diagnosis not present

## 2016-08-24 DIAGNOSIS — J019 Acute sinusitis, unspecified: Secondary | ICD-10-CM | POA: Diagnosis not present

## 2016-08-24 MED ORDER — LEVOFLOXACIN 500 MG PO TABS: 500.0000 mg | ORAL_TABLET | Freq: Every day | ORAL | 0 refills | Status: DC

## 2016-08-24 MED ORDER — DICLOFENAC SODIUM 75 MG PO TBEC
75.0000 mg | DELAYED_RELEASE_TABLET | Freq: Two times a day (BID) | ORAL | 0 refills | Status: DC
Start: 2016-08-24 — End: 2016-10-19

## 2016-08-24 NOTE — Progress Notes (Signed)
   Subjective:    Patient ID: Amy Russell, female    DOB: 1981-02-21, 36 y.o.   MRN: [id number]  Otalgia   There is pain in both ears. This is a new problem. The current episode started 1 to 4 weeks ago. The problem occurs constantly. The problem has been unchanged. There has been no fever. The pain is at a severity of 5/10. The pain is moderate. Associated symptoms include coughing, headaches, neck pain, rhinorrhea and a sore throat. She has tried antibiotics and acetaminophen for the symptoms. The treatment provided no relief.   This patient was seen recently was diagnosed with sinus infection put on Augmentin now she is having mild sinus pressure pain discomfort discolored drainage also having tenderness in the back part of the right occipital region. No vomiting no double vision no high fevers   Review of Systems  HENT: Positive for ear pain, rhinorrhea and sore throat.   Respiratory: Positive for cough.   Musculoskeletal: Positive for neck pain.  Neurological: Positive for headaches.       Objective:   Physical Exam Lungs clear no crackles heart regular pulse normal she does have moderate frontal sinus tenderness more on the right than the left she also has tenderness in the right occipital region of the scalp patient not toxic       Assessment & Plan:  I do not feel the patient has any meningitis or abscess Sinus infection Levaquin 10 days as directed Diclofenac twice daily as needed to help with occipital neuralgia Patient should gradually get better over the next 10-14 days if ongoing troubles to notify us

## 2016-09-17 ENCOUNTER — Telehealth: Payer: Self-pay

## 2016-09-17 NOTE — Telephone Encounter (Signed)
[facility] Qvar 80 mcg Oral inhaler 120 doses Inhale 2 puffs into the lungs Bid daily to Qvar Redihaler.Please advise.

## 2016-09-17 NOTE — Telephone Encounter (Signed)
Qvar ready inhaler 2 inhalations twice daily, 1 canister 12 refills

## 2016-09-18 ENCOUNTER — Other Ambulatory Visit: Payer: Self-pay | Admitting: *Deleted

## 2016-09-18 MED ORDER — BECLOMETHASONE DIPROP HFA 80 MCG/ACT IN AERB: 2.0000 | INHALATION_SPRAY | Freq: Two times a day (BID) | RESPIRATORY_TRACT | 12 refills | Status: DC

## 2016-09-18 NOTE — Telephone Encounter (Signed)
rx sent to pharm

## 2016-09-22 ENCOUNTER — Telehealth: Payer: Self-pay | Admitting: Family Medicine

## 2016-09-22 MED ORDER — FLUTICASONE PROPIONATE HFA 110 MCG/ACT IN AERO: 2.0000 | INHALATION_SPRAY | Freq: Two times a day (BID) | RESPIRATORY_TRACT | 5 refills | Status: DC

## 2016-09-22 NOTE — Telephone Encounter (Signed)
Prescription sent electronically to pharmacy. Patient notified. 

## 2016-09-22 NOTE — Telephone Encounter (Signed)
Qvar Redihaler not covered. Flovent is covered drug of choice in this class may we prescribe?

## 2016-09-22 NOTE — Telephone Encounter (Signed)
Surprising the constant twists and turns-given the newest information Flovent 110 g 2 puffs twice a day, 1 canister, 3 refills, follow-up if ongoing troubles

## 2016-10-02 ENCOUNTER — Encounter: Payer: Self-pay | Admitting: Nurse Practitioner

## 2016-10-02 ENCOUNTER — Ambulatory Visit (INDEPENDENT_AMBULATORY_CARE_PROVIDER_SITE_OTHER): Payer: BC Managed Care – PPO | Admitting: Nurse Practitioner

## 2016-10-02 VITALS — BP 110/72 | Temp 98.2°F | Ht 67.5 in | Wt 233.0 lb

## 2016-10-02 DIAGNOSIS — J310 Chronic rhinitis: Secondary | ICD-10-CM

## 2016-10-02 DIAGNOSIS — J329 Chronic sinusitis, unspecified: Secondary | ICD-10-CM | POA: Diagnosis not present

## 2016-10-02 MED ORDER — HYDROCODONE-HOMATROPINE 5-1.5 MG/5ML PO SYRP: 5.0000 mL | ORAL_SOLUTION | ORAL | 0 refills | Status: DC | PRN

## 2016-10-02 MED ORDER — PREDNISONE 20 MG PO TABS: ORAL_TABLET | ORAL | 0 refills | Status: DC

## 2016-10-02 MED ORDER — LEVOFLOXACIN 500 MG PO TABS: 500.0000 mg | ORAL_TABLET | Freq: Every day | ORAL | 0 refills | Status: DC

## 2016-10-03 ENCOUNTER — Encounter: Payer: Self-pay | Admitting: Nurse Practitioner

## 2016-10-03 NOTE — Progress Notes (Signed)
Subjective:  Presents for c/o sore throat, headache and cough that began 3 days. Possible fever, some chills. Facial area headache. Cough improved from previous visit but now worse. Ear pain. Slight wheezing. Just picked up Flovent. No vomiting, diarrhea or abd pain. No acid reflux. Using albuterol several times per day.   Objective:   BP 110/72   Temp 98.2 F (36.8 C) (Oral)   Ht 5' 7.5" (1.715 m)   Wt 233 lb (105.7 kg)   BMI 35.95 kg/m  NAD. Alert, oriented. TMs clear effusion, no erythema. Posterior pharynx erythema with green PND noted. Neck supple with mild anterior adenopathy. Lungs clear. Occasional non productive cough. Heart RRR.   Assessment:  Rhinosinusitis    Plan:   Meds ordered this encounter  Medications  . levofloxacin (LEVAQUIN) 500 MG tablet    Sig: Take 1 tablet (500 mg total) by mouth daily.    Dispense:  10 tablet    Refill:  0    Order Specific Question:   Supervising Provider    Answer:   LUKING, WILLIAM S [[id number]]  .Merlyn Albert HYDROcodone-homatropine (HYCODAN) 5-1.5 MG/5ML syrup    Sig: Take 5 mLs by mouth every 4 (four) hours as needed.    Dispense:  90 mL    Refill:  0    Order Specific Question:   Supervising Provider    Answer:   Merlyn AlbertLUKING, WILLIAM S [[id number]]  . predniSONE (DELTASONE) 20 MG tablet    Sig: 3 po qd x 3 d then 2 po qd x 3 d then 1 po qd x 2 d    Dispense:  17 tablet    Refill:  0    Order Specific Question:   Supervising Provider    Answer:   Riccardo DubinLUKING, WILLIAM S [[id number]]   Given Rx for Prednisone in case it is needed over the weekend. Start Flovent as directed. Call back if worsens or persists.

## 2016-10-19 ENCOUNTER — Ambulatory Visit (INDEPENDENT_AMBULATORY_CARE_PROVIDER_SITE_OTHER): Payer: BC Managed Care – PPO | Admitting: Family Medicine

## 2016-10-19 ENCOUNTER — Encounter: Payer: Self-pay | Admitting: Family Medicine

## 2016-10-19 VITALS — BP 132/84 | Temp 98.0°F | Wt 236.2 lb

## 2016-10-19 DIAGNOSIS — R5383 Other fatigue: Secondary | ICD-10-CM

## 2016-10-19 DIAGNOSIS — K2100 Gastro-esophageal reflux disease with esophagitis, without bleeding: Secondary | ICD-10-CM

## 2016-10-19 DIAGNOSIS — G44209 Tension-type headache, unspecified, not intractable: Secondary | ICD-10-CM | POA: Diagnosis not present

## 2016-10-19 DIAGNOSIS — J4520 Mild intermittent asthma, uncomplicated: Secondary | ICD-10-CM

## 2016-10-19 DIAGNOSIS — M5481 Occipital neuralgia: Secondary | ICD-10-CM | POA: Diagnosis not present

## 2016-10-19 NOTE — Progress Notes (Signed)
   Subjective:    Patient ID: Amy Russell, female    DOB: [birth year], 36 y.o.   MRN: [id number]  HPI Patient in today for chest tightness. Patient also has c/o of headache. Has tried ibuprofen.   Pain with deep breath or changing osition  occs cough  Worse with movement  Feeling sick for the past few weeks  Dim energy   BP up recently  Frontal rad to a back of neck  tskes ibu poren   cougjn a lot two weeks ago  Pain is at times sharp but also aching next  Pretty significant stress lately. He is just started school. 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 Next  Now told her  science. Not certain as scheduled.  Having headaches. Frontal radiating to nuchal and symmetric. Steady at times sharp and other times.  Continues to use preventive agents as far as asthma. Still using rescue inhaler though noticing less active wheezing at this time.  Also notes fatigue and tiredness  States no other concerns this visit.   Review of Systems No headache, no major weight loss or weight gain, no chest pain no back pain abdominal pain no change in bowel habits complete ROS otherwise negative     Objective:   Physical Exam  Alert and oriented, vitals reviewed and stable, NAD ENT-TM's and ext canals WNL bilat via otoscopic exam Soft palate, tonsils and post pharynx WNL via oropharyngeal exam Neck-symmetric, no masses; thyroid nonpalpable and nontender Pulmonary-no tachypnea or accessory muscle use; Clear without wheezes via auscultation Card--no abnrml murmurs, rhythm reg and rate WNL Carotid pulses symmetric, without bruits Anterior chest wall tenderness      Assessment & Plan:  Impression #1 costochondritis due to recent flare of cough and cold and congestion. Using anti-inflammatory No. 2 reflux per history add ranitidine 75 twice a  day while using anti-inflammatory regularly #3 asthma recent exacerbation control improving maintain preventive agents #4 fatigue likely multifactorial #5 increased stress substantial #6 headaches. Probable tension all meds discussed. In addition to all above recommend regular exercise rationale discussed

## 2016-12-11 ENCOUNTER — Telehealth: Payer: Self-pay | Admitting: Family Medicine

## 2016-12-11 MED ORDER — AMOXICILLIN 500 MG PO CAPS: 500.0000 mg | ORAL_CAPSULE | Freq: Three times a day (TID) | ORAL | 0 refills | Status: DC

## 2016-12-11 NOTE — Telephone Encounter (Signed)
Patient would like an antibiotic called into [facility]

## 2016-12-11 NOTE — Telephone Encounter (Signed)
Patient said her son Ivin Booty) seen Dr. Lorin Picket on 12/03/16 for rhinosinusitis.  She said Dr. Lorin Picket told her to call if she started feeling bad and we would call something in.  She said she is having terrible cough, sore throat, headache.  She is not having trouble breathing.   [facility]

## 2016-12-11 NOTE — Telephone Encounter (Signed)
Amoxicillin 500 mg 1 3 times a day for 10 days follow-up if progressive troubles or if worse

## 2016-12-11 NOTE — Telephone Encounter (Signed)
Spoke with patient and informed her per Dr.Scott Luking- we are going to send in Amoxicillin 500 mg. Take 1 tablet by mouth 3 times a day for 10 days. Follow up if progressive troubles. Patient verbalized understanding.

## 2017-01-27 ENCOUNTER — Other Ambulatory Visit: Payer: Self-pay | Admitting: Nurse Practitioner

## 2017-01-27 MED ORDER — ONDANSETRON 8 MG PO TBDP: 8.0000 mg | ORAL_TABLET | Freq: Three times a day (TID) | ORAL | 0 refills | Status: DC | PRN

## 2017-02-19 ENCOUNTER — Encounter: Payer: Self-pay | Admitting: Family Medicine

## 2017-02-19 ENCOUNTER — Ambulatory Visit: Payer: BC Managed Care – PPO | Admitting: Family Medicine

## 2017-02-19 VITALS — BP 128/82 | Temp 97.9°F | Ht 67.0 in | Wt 230.0 lb

## 2017-02-19 DIAGNOSIS — J019 Acute sinusitis, unspecified: Secondary | ICD-10-CM

## 2017-02-19 DIAGNOSIS — J4520 Mild intermittent asthma, uncomplicated: Secondary | ICD-10-CM | POA: Diagnosis not present

## 2017-02-19 MED ORDER — PREDNISONE 10 MG PO TABS: ORAL_TABLET | ORAL | 0 refills | Status: DC

## 2017-02-19 MED ORDER — HYDROCODONE-HOMATROPINE 5-1.5 MG/5ML PO SYRP: 5.0000 mL | ORAL_SOLUTION | Freq: Every evening | ORAL | 0 refills | Status: DC | PRN

## 2017-02-19 MED ORDER — CEFDINIR 300 MG PO CAPS: 300.0000 mg | ORAL_CAPSULE | Freq: Two times a day (BID) | ORAL | 0 refills | Status: DC

## 2017-02-19 NOTE — Progress Notes (Signed)
   Subjective:    Patient ID: Amy Russell, female    DOB: 1980/08/29, 36 y.o.   MRN: [id number]  Cough  This is a new problem. The current episode started 1 to 4 weeks ago. Associated symptoms include headaches, nasal congestion, a sore throat and wheezing. Associated symptoms comments: Vomiting and diarrhea.   Pos cough bad with occas vomiting  Stress brother ha d a heart attack, severe, triggered,  Pos drainage    cough non profductiv   Using both steroid inhaker    Four times per da on the albuterol     Review of Systems  HENT: Positive for sore throat.   Respiratory: Positive for cough and wheezing.   Neurological: Positive for headaches.       Objective:   Physical Exam Alert, mild malaise. Hydration good Vitals stable. frontal/ maxillary tenderness evident positive nasal congestion. pharynx normal neck supple  lungs clear/no crackles or positive wheezes present with,. heart regular in rhythm        Assessment & Plan:  Impression rhinosinusitis/bronchitis plus reactive airways likely post viral, discussed with patient. plan antibiotics prescribed. Questions answered. Symptomatic care discussed. warning signs discussed. WSL

## 2017-03-15 ENCOUNTER — Other Ambulatory Visit: Payer: Self-pay | Admitting: Nurse Practitioner

## 2017-03-15 MED ORDER — LEVOFLOXACIN 500 MG PO TABS: 500.0000 mg | ORAL_TABLET | Freq: Every day | ORAL | 0 refills | Status: DC

## 2017-03-16 ENCOUNTER — Other Ambulatory Visit: Payer: Self-pay | Admitting: Nurse Practitioner

## 2017-03-16 ENCOUNTER — Telehealth: Payer: Self-pay | Admitting: *Deleted

## 2017-03-16 MED ORDER — HYDROCODONE-HOMATROPINE 5-1.5 MG/5ML PO SYRP: 5.0000 mL | ORAL_SOLUTION | Freq: Every evening | ORAL | 0 refills | Status: DC | PRN

## 2017-03-16 MED ORDER — BENZONATATE 100 MG PO CAPS: 100.0000 mg | ORAL_CAPSULE | Freq: Three times a day (TID) | ORAL | 0 refills | Status: DC | PRN

## 2017-03-16 NOTE — Telephone Encounter (Signed)
Done

## 2017-03-16 NOTE — Telephone Encounter (Signed)
Pt seen recently and currently taking levaquin. She is requesting tessalon pearls for cough to take at work and hycodan to take at night.   [facility] [place].

## 2017-03-18 ENCOUNTER — Encounter: Payer: Self-pay | Admitting: Family Medicine

## 2017-03-18 ENCOUNTER — Ambulatory Visit: Payer: BC Managed Care – PPO | Admitting: Family Medicine

## 2017-03-18 VITALS — BP 124/80 | Temp 97.6°F | Ht 67.0 in | Wt 234.0 lb

## 2017-03-18 DIAGNOSIS — J019 Acute sinusitis, unspecified: Secondary | ICD-10-CM | POA: Diagnosis not present

## 2017-03-18 DIAGNOSIS — J4521 Mild intermittent asthma with (acute) exacerbation: Secondary | ICD-10-CM

## 2017-03-18 DIAGNOSIS — J209 Acute bronchitis, unspecified: Secondary | ICD-10-CM | POA: Diagnosis not present

## 2017-03-18 MED ORDER — PREDNISONE 20 MG PO TABS: ORAL_TABLET | ORAL | 0 refills | Status: DC

## 2017-03-18 NOTE — Progress Notes (Signed)
   Subjective:    Patient ID: Amy Russell, female    DOB: [birth year], 37 y.o.   MRN: [id number]  Sinusitis  This is a new problem. The current episode started yesterday. Associated symptoms include congestion, coughing, headaches and a sore throat. Pertinent negatives include no ear pain or shortness of breath. (Fever)   Significant head congestion drainage coughing chest congestion some fever denies wheezing or difficulty breathing   Review of Systems  Constitutional: Negative for activity change and fever.  HENT: Positive for congestion, rhinorrhea and sore throat. Negative for ear pain.   Eyes: Negative for discharge.  Respiratory: Positive for cough. Negative for shortness of breath and wheezing.   Cardiovascular: Negative for chest pain.  Neurological: Positive for headaches.       Objective:   Physical Exam  Constitutional: She appears well-developed.  HENT:  Head: Normocephalic.  Right Ear: External ear normal.  Left Ear: External ear normal.  Nose: Nose normal.  Mouth/Throat: Oropharynx is clear and moist. No oropharyngeal exudate.  Eyes: Right eye exhibits no discharge. Left eye exhibits no discharge.  Neck: Neck supple. No tracheal deviation present.  Cardiovascular: Normal rate and normal heart sounds.  No murmur heard. Pulmonary/Chest: Effort normal and breath sounds normal. She has no wheezes. She has no rales.  Lymphadenopathy:    She has no cervical adenopathy.  Skin: Skin is warm and dry.  Nursing note and vitals reviewed.    I do not feel the patient needs x-rays or lab work does not appear respiratory distress     Assessment & Plan:  Viral syndrome Secondary sinusitis Secondary bronchitis if progressive troubles or worse follow-up Antibiotics prescribed continue inhalers, prednisone taper, cough medication cautioned drowsiness home use on

## 2017-04-06 ENCOUNTER — Ambulatory Visit: Payer: BC Managed Care – PPO | Admitting: Family Medicine

## 2017-04-06 ENCOUNTER — Encounter: Payer: Self-pay | Admitting: Family Medicine

## 2017-04-06 VITALS — BP 122/84 | Temp 98.3°F | Ht 67.0 in | Wt 236.0 lb

## 2017-04-06 DIAGNOSIS — J4541 Moderate persistent asthma with (acute) exacerbation: Secondary | ICD-10-CM | POA: Diagnosis not present

## 2017-04-06 DIAGNOSIS — J019 Acute sinusitis, unspecified: Secondary | ICD-10-CM

## 2017-04-06 MED ORDER — BENZONATATE 100 MG PO CAPS: 100.0000 mg | ORAL_CAPSULE | Freq: Three times a day (TID) | ORAL | 0 refills | Status: DC | PRN

## 2017-04-06 MED ORDER — MONTELUKAST SODIUM 10 MG PO TABS: ORAL_TABLET | ORAL | 2 refills | Status: DC

## 2017-04-06 MED ORDER — PREDNISONE 20 MG PO TABS: ORAL_TABLET | ORAL | 0 refills | Status: DC

## 2017-04-06 MED ORDER — HYDROCODONE-HOMATROPINE 5-1.5 MG/5ML PO SYRP: 5.0000 mL | ORAL_SOLUTION | Freq: Every evening | ORAL | 0 refills | Status: DC | PRN

## 2017-04-06 MED ORDER — LEVOFLOXACIN 500 MG PO TABS: 500.0000 mg | ORAL_TABLET | Freq: Every day | ORAL | 0 refills | Status: AC

## 2017-04-06 NOTE — Progress Notes (Signed)
   Subjective:    Patient ID: Amy Russell, female    DOB: Jun 05, 1980, 37 y.o.   MRN: [id number]  Sinusitis  This is a new problem. The current episode started more than 1 month ago. Associated symptoms include congestion, coughing, headaches and a sore throat. (Fever) Treatments tried: afrin, ibuprofen, alka seltzer, delsym.   Some allrgies off and on , zytrtec Headache.  Frontal in nature.  Positive nasal discharge.  Also unfortunately wheezing still a major ongoing problem.  Patient claims compliance with her steroid inhaler.  Has had several bouts this winter.  Ongoing challenges and plenty of wheezing in recent days.  Review of Systems  HENT: Positive for congestion and sore throat.   Respiratory: Positive for cough.   Neurological: Positive for headaches.       Objective:   Physical Exam  Alert active good hydration positive nasal discharge positive frontal tenderness good hydration lungs bilateral wheezes no tachypnea no inspiratory crackles heart regular rate and rhythm.      Assessment & Plan:  Impression rhinosinusitis/bronchitis with ongoing major challenges with asthma.  Add Singulair.  Steroids prescribed.  Once again.  Antibiotics prescribed due to rhinosinusitis element.  I really do feel her chest symptoms are more chronic inflammation which needs to be controlled better follow-up as scheduled

## 2017-05-19 ENCOUNTER — Telehealth: Payer: Self-pay | Admitting: Family Medicine

## 2017-05-19 NOTE — Telephone Encounter (Signed)
[facility] Please review and fill in highlighted areas,date,sign.Form is in yellow folder.

## 2017-05-20 NOTE — Telephone Encounter (Signed)
Form was completed to the best of our ability 

## 2017-05-27 DIAGNOSIS — Z029 Encounter for administrative examinations, unspecified: Secondary | ICD-10-CM

## 2017-07-15 ENCOUNTER — Encounter: Payer: Self-pay | Admitting: Family Medicine

## 2017-07-15 ENCOUNTER — Ambulatory Visit: Payer: BC Managed Care – PPO | Admitting: Family Medicine

## 2017-07-15 VITALS — BP 138/86 | Temp 98.5°F | Ht 67.0 in | Wt 240.0 lb

## 2017-07-15 DIAGNOSIS — R21 Rash and other nonspecific skin eruption: Secondary | ICD-10-CM | POA: Diagnosis not present

## 2017-07-15 MED ORDER — PREDNISONE 20 MG PO TABS: ORAL_TABLET | ORAL | 0 refills | Status: DC

## 2017-07-15 MED ORDER — HYDROCORTISONE 2.5 % EX CREA: TOPICAL_CREAM | CUTANEOUS | 2 refills | Status: DC

## 2017-07-15 NOTE — Progress Notes (Signed)
   Subjective:    Patient ID: Amy Russell, female    DOB: 1980-06-14, 37 y.o.   MRN: [id number]  Rash  Location: face, scalp, and arms. The rash is characterized by itchiness. Treatments tried: benadryl. The treatment provided no relief.   Home last two days  Face rashy and itching and scalp burnign   Benadryl    not tend s toward rashes  Allergies more flared up at normal, claritin and singulair  Using reg, sasthma overall a little flared   Recalls no facial exposures, uses the same  Facial moisturizeer      Review of Systems  Skin: Positive for rash.       Objective:   Physical Exam  Alert vitals stable, NAD. Blood pressure good on repeat. HEENT normal. Lungs clear. Heart regular rate and rhythm. Diffuse erythematous raised maculopapular rash to cheeks and forehead, nontender, some involvement also on scalp      Assessment & Plan:  1 impression nonspecific rash occurring with flare of allergic rhinitis prednisone taper local steroid cream.  Patient could not think of any instigating feature or medicine or topical exposure.  Started a new shampoo but this was weeks ago.  Expect gradual resolution

## 2017-09-06 ENCOUNTER — Telehealth: Payer: Self-pay | Admitting: Family Medicine

## 2017-09-06 NOTE — Telephone Encounter (Signed)
Patient contacted office and stated that she has had diarrhea for 14 days, stomach pain, and yesterday had a little blood in stool. Patient states that she still has hives from her last visit which was 07/25/2017. Pt stated that she has taking several meds for diarrhea. Pt advised to go to ER for evaluation. Pt verbalized understanding.

## 2017-09-06 NOTE — Telephone Encounter (Signed)
Alternative option if they are unable to go to ER today as I can see them tomorrow

## 2017-09-06 NOTE — Telephone Encounter (Signed)
Contacted patient and patient stated she wasn't going to ER. Pt is setting up appt for tomorrow.

## 2017-09-07 ENCOUNTER — Ambulatory Visit: Payer: BC Managed Care – PPO | Admitting: Family Medicine

## 2017-09-07 ENCOUNTER — Encounter: Payer: Self-pay | Admitting: Family Medicine

## 2017-09-07 VITALS — Temp 97.5°F | Ht 67.0 in | Wt 238.2 lb

## 2017-09-07 DIAGNOSIS — R197 Diarrhea, unspecified: Secondary | ICD-10-CM

## 2017-09-07 DIAGNOSIS — R1084 Generalized abdominal pain: Secondary | ICD-10-CM | POA: Diagnosis not present

## 2017-09-07 DIAGNOSIS — L508 Other urticaria: Secondary | ICD-10-CM

## 2017-09-07 HISTORY — DX: Other urticaria: L50.8

## 2017-09-07 NOTE — Progress Notes (Signed)
   Subjective:    Patient ID: Amy Russell, female    DOB: 20-Mar-1980, 37 y.o.   MRN: [id number]  HPI  Patient arrives with abdominal pain and diarrhea. Patient is not been camping Family member did have diarrhea Patient currently having diarrhea mucousy's denies high fever chills sweats denies wheezing difficulty breathing denies any bloody stools States overall energy level subpar Has had some mucousy stools  Patient also still has her hives despite her prednisone Gets intermittent hives on the face arms back no wheezing or difficulty breathing  Review of Systems  Constitutional: Negative for activity change and appetite change.  HENT: Negative for congestion and rhinorrhea.   Respiratory: Negative for cough and shortness of breath.   Cardiovascular: Negative for chest pain and leg swelling.  Gastrointestinal: Positive for abdominal pain and diarrhea. Negative for constipation and vomiting.  Skin: Negative for color change.  Neurological: Negative for dizziness and weakness.  Psychiatric/Behavioral: Negative for agitation and confusion.       Objective:   Physical Exam HEENT benign TMs are in normal throat is normal neck no masses lungs clear no crackles heart regular no murmurs abdomen soft no guarding or rebound       Assessment & Plan:  Intermittent hives recommend follow-up with allergist for further testing probable chronic urticaria it is possible though that thorough work-up will not revolve resolve this issue  Warning signs regarding hives discussed  Diarrhea run testing could be enteritis could be colitis issue may need referral to gastroenterology depending on the results

## 2017-09-08 ENCOUNTER — Other Ambulatory Visit: Payer: Self-pay

## 2017-09-08 DIAGNOSIS — R1084 Generalized abdominal pain: Secondary | ICD-10-CM

## 2017-09-08 DIAGNOSIS — R197 Diarrhea, unspecified: Secondary | ICD-10-CM

## 2017-09-08 LAB — HEPATIC FUNCTION PANEL
ALK PHOS: 99 IU/L (ref 39–117)
ALT: 10 IU/L (ref 0–32)
AST: 14 IU/L (ref 0–40)
Albumin: 4.2 g/dL (ref 3.5–5.5)
BILIRUBIN, DIRECT: 0.07 mg/dL (ref 0.00–0.40)
Bilirubin Total: <0.2 mg/dL (ref 0.0–1.2)
Total Protein: 7.9 g/dL (ref 6.0–8.5)

## 2017-09-08 LAB — CBC WITH DIFFERENTIAL/PLATELET
BASOS ABS: 0 10*3/uL (ref 0.0–0.2)
Basos: 0 %
EOS (ABSOLUTE): 0.1 10*3/uL (ref 0.0–0.4)
Eos: 1 %
HEMOGLOBIN: 13.7 g/dL (ref 11.1–15.9)
Hematocrit: 42.8 % (ref 34.0–46.6)
Immature Grans (Abs): 0 10*3/uL (ref 0.0–0.1)
Immature Granulocytes: 0 %
LYMPHS ABS: 3.3 10*3/uL — AB (ref 0.7–3.1)
Lymphs: 32 %
MCH: 27.7 pg (ref 26.6–33.0)
MCHC: 32 g/dL (ref 31.5–35.7)
MCV: 87 fL (ref 79–97)
MONOCYTES: 4 %
Monocytes Absolute: 0.4 10*3/uL (ref 0.1–0.9)
NEUTROS ABS: 6.5 10*3/uL (ref 1.4–7.0)
Neutrophils: 63 %
Platelets: 310 10*3/uL (ref 150–450)
RBC: 4.95 x10E6/uL (ref 3.77–5.28)
RDW: 14.9 % (ref 12.3–15.4)
WBC: 10.3 10*3/uL (ref 3.4–10.8)

## 2017-09-08 LAB — BASIC METABOLIC PANEL
BUN / CREAT RATIO: 16 (ref 9–23)
BUN: 13 mg/dL (ref 6–20)
CO2: 24 mmol/L (ref 20–29)
CREATININE: 0.83 mg/dL (ref 0.57–1.00)
Calcium: 9.5 mg/dL (ref 8.7–10.2)
Chloride: 101 mmol/L (ref 96–106)
GFR calc Af Amer: 103 mL/min/{1.73_m2} (ref >59)
GFR calc non Af Amer: 89 mL/min/{1.73_m2} (ref >59)
GLUCOSE: 91 mg/dL (ref 65–99)
Potassium: 4.5 mmol/L (ref 3.5–5.2)
SODIUM: 139 mmol/L (ref 134–144)

## 2017-09-08 LAB — SEDIMENTATION RATE: SED RATE: 17 mm/h (ref 0–32)

## 2017-09-08 LAB — IFOBT (OCCULT BLOOD): IMMUNOLOGICAL FECAL OCCULT BLOOD TEST: POSITIVE

## 2017-09-08 LAB — TISSUE TRANSGLUTAMINASE, IGA: Transglutaminase IgA: <2 U/mL (ref 0–3)

## 2017-09-08 LAB — LIPASE: LIPASE: 30 U/L (ref 14–72)

## 2017-09-10 ENCOUNTER — Other Ambulatory Visit: Payer: Self-pay | Admitting: Family Medicine

## 2017-09-10 DIAGNOSIS — R197 Diarrhea, unspecified: Secondary | ICD-10-CM

## 2017-09-10 LAB — CLOSTRIDIUM DIFFICILE BY PCR: CDIFFPCR: NEGATIVE

## 2017-09-13 ENCOUNTER — Encounter (INDEPENDENT_AMBULATORY_CARE_PROVIDER_SITE_OTHER): Payer: Self-pay

## 2017-09-13 ENCOUNTER — Encounter: Payer: Self-pay | Admitting: Family Medicine

## 2017-09-13 LAB — STOOL CULTURE: E COLI SHIGA TOXIN ASSAY: NEGATIVE

## 2017-09-14 LAB — OVA AND PARASITE EXAMINATION

## 2017-09-15 ENCOUNTER — Encounter: Payer: Self-pay | Admitting: Family Medicine

## 2017-09-15 ENCOUNTER — Encounter (INDEPENDENT_AMBULATORY_CARE_PROVIDER_SITE_OTHER): Payer: Self-pay

## 2017-09-17 ENCOUNTER — Encounter: Payer: Self-pay | Admitting: Internal Medicine

## 2017-09-20 ENCOUNTER — Ambulatory Visit: Payer: BC Managed Care – PPO | Admitting: Family Medicine

## 2017-09-20 ENCOUNTER — Encounter: Payer: Self-pay | Admitting: Family Medicine

## 2017-09-20 VITALS — BP 132/86 | Temp 97.9°F | Ht 67.0 in | Wt 237.0 lb

## 2017-09-20 DIAGNOSIS — R1030 Lower abdominal pain, unspecified: Secondary | ICD-10-CM | POA: Diagnosis not present

## 2017-09-20 DIAGNOSIS — R197 Diarrhea, unspecified: Secondary | ICD-10-CM

## 2017-09-20 MED ORDER — MONTELUKAST SODIUM 10 MG PO TABS: ORAL_TABLET | ORAL | 6 refills | Status: DC

## 2017-09-20 NOTE — Progress Notes (Signed)
   Subjective:    Patient ID: Amy Russell, female    DOB: 1981-01-15, 37 y.o.   MRN: [id number]  Abdominal pain, vomiting and diarrhea for the past 3 -4 weeks. Tried bland diet, probiotics, immodium, tylenol.  She has ongoing loose stools mucousy stools recent testing was positive for blood cultures were negative she has ongoing lower abdominal pain and discomfort unfortunately our urgent referral for gastroenterology has been assigned an appointment in October this will not work out Left ear pain. Itching inside. Would like refill on singulair.  Patient with significant allergy issues denies any mucus denies any sinus infection she has not been on any antibiotics recently  Review of Systems  Constitutional: Negative for activity change and appetite change.  HENT: Positive for rhinorrhea. Negative for congestion.   Respiratory: Negative for cough and shortness of breath.   Cardiovascular: Negative for chest pain.  Gastrointestinal: Positive for abdominal pain, diarrhea and vomiting.  Genitourinary: Negative for dysuria and flank pain.  Skin: Negative for color change.  Neurological: Negative for weakness.  Psychiatric/Behavioral: Negative for confusion.       Objective:   Physical Exam  Constitutional: She appears well-nourished. No distress.  Cardiovascular: Normal rate, regular rhythm and normal heart sounds.  No murmur heard. Pulmonary/Chest: Effort normal and breath sounds normal. No respiratory distress.  Abdominal: Soft. She exhibits no distension and no mass. There is tenderness. There is no guarding.  Musculoskeletal: She exhibits no edema.  Lymphadenopathy:    She has no cervical adenopathy.  Neurological: She is alert. She exhibits normal muscle tone.  Psychiatric: Her behavior is normal.  Vitals reviewed. Patient has tenderness in the lower pelvic region left lower quadrant right lower quadrant and middle quadrant no guarding or rebound        Assessment &  Plan:  Persistent diarrhea with heme positive in addition to this rest of testing negative I believe the patient may have colitis may end up needing to have CT scan definitely needs colonoscopy with biopsies for possibility of colitis I doubt colon cancer we will discuss with gastroenterology to see if it is possible for us to be able to move her appointment up within the next few weeks

## 2017-09-21 ENCOUNTER — Telehealth: Payer: Self-pay

## 2017-09-21 NOTE — Progress Notes (Signed)
Left message with Darl PikesSusan at [facility], Verlon AuLeslie is in a room with a pt at this time and was given Dr.Scotts cell # to  Call once she is available.

## 2017-09-21 NOTE — Telephone Encounter (Signed)
Very good, patient may still end up needing to have CT scan we will discuss further with Tana CoastLeslie Lewis, PA

## 2017-09-21 NOTE — Telephone Encounter (Signed)
Patient is aware that she is scheduled 09/24/2017 at 8:30 am [facility].

## 2017-09-22 NOTE — Progress Notes (Signed)
I am glad that they are able to see this patient on Friday.  At that point they can decide further work-up.  Given the diarrhea and heme positive it is possible that there could be colitis going on and may need colonoscopy with biopsies.  Patient also having some lower abdominal tenderness we will allow for gastroenterology to decide regarding CAT scan or not

## 2017-09-22 NOTE — Progress Notes (Signed)
Amy Russell supposed to call office and I notified the pt that she has an appt with them on Friday.

## 2017-09-24 ENCOUNTER — Encounter: Payer: Self-pay | Admitting: *Deleted

## 2017-09-24 ENCOUNTER — Other Ambulatory Visit (HOSPITAL_COMMUNITY)
Admission: RE | Admit: 2017-09-24 | Discharge: 2017-09-24 | Disposition: A | Discharge status: Home / Self Care | Payer: BC Managed Care – PPO | Source: Ambulatory Visit | Attending: Gastroenterology | Admitting: Gastroenterology

## 2017-09-24 ENCOUNTER — Other Ambulatory Visit: Payer: Self-pay | Admitting: *Deleted

## 2017-09-24 ENCOUNTER — Ambulatory Visit: Payer: BC Managed Care – PPO | Admitting: Gastroenterology

## 2017-09-24 ENCOUNTER — Other Ambulatory Visit: Payer: Self-pay

## 2017-09-24 ENCOUNTER — Encounter: Payer: Self-pay | Admitting: Gastroenterology

## 2017-09-24 ENCOUNTER — Telehealth: Payer: Self-pay | Admitting: *Deleted

## 2017-09-24 ENCOUNTER — Ambulatory Visit (HOSPITAL_COMMUNITY)
Admission: RE | Admit: 2017-09-24 | Discharge: 2017-09-24 | Disposition: A | Discharge status: Home / Self Care | Payer: BC Managed Care – PPO | Source: Ambulatory Visit | Attending: Gastroenterology | Admitting: Gastroenterology

## 2017-09-24 VITALS — BP 133/88 | HR 91 | Temp 97.7°F | Ht 67.5 in | Wt 240.0 lb

## 2017-09-24 DIAGNOSIS — Z9049 Acquired absence of other specified parts of digestive tract: Secondary | ICD-10-CM | POA: Insufficient documentation

## 2017-09-24 DIAGNOSIS — R1030 Lower abdominal pain, unspecified: Secondary | ICD-10-CM | POA: Diagnosis present

## 2017-09-24 DIAGNOSIS — K625 Hemorrhage of anus and rectum: Secondary | ICD-10-CM

## 2017-09-24 DIAGNOSIS — K219 Gastro-esophageal reflux disease without esophagitis: Secondary | ICD-10-CM | POA: Diagnosis not present

## 2017-09-24 DIAGNOSIS — R197 Diarrhea, unspecified: Secondary | ICD-10-CM

## 2017-09-24 DIAGNOSIS — R109 Unspecified abdominal pain: Secondary | ICD-10-CM | POA: Insufficient documentation

## 2017-09-24 LAB — PREGNANCY, URINE: Preg Test, Ur: NEGATIVE

## 2017-09-24 MED ORDER — ONDANSETRON HCL 4 MG PO TABS: 4.0000 mg | ORAL_TABLET | Freq: Three times a day (TID) | ORAL | 1 refills | Status: DC | PRN

## 2017-09-24 MED ORDER — IOPAMIDOL (ISOVUE-300) INJECTION 61%
30.0000 mL | Freq: Once | INTRAVENOUS | Status: DC | PRN
  Filled 2017-09-24: qty 30

## 2017-09-24 MED ORDER — IOPAMIDOL (ISOVUE-300) INJECTION 61%
100.0000 mL | Freq: Once | INTRAVENOUS | Status: AC | PRN
  Administered 2017-09-24: 100 mL via INTRAVENOUS

## 2017-09-24 MED ORDER — PEG 3350-KCL-NA BICARB-NACL 420 G PO SOLR: 4000.0000 mL | Freq: Once | ORAL | 0 refills | Status: AC

## 2017-09-24 MED ORDER — PANTOPRAZOLE SODIUM 40 MG PO TBEC: 40.0000 mg | DELAYED_RELEASE_TABLET | Freq: Every day | ORAL | 3 refills | Status: DC

## 2017-09-24 NOTE — Telephone Encounter (Signed)
PA for CT ABD/PELVIS W/ CONTRAST was approved via [employer] website. Order # [id number]. 09/24/17-10/23/17.

## 2017-09-24 NOTE — Progress Notes (Signed)
CC'ED TO PCP 

## 2017-09-24 NOTE — Assessment & Plan Note (Signed)
37 year old female presenting with diarrhea, rectal bleeding, abdominal pain and cramping, worsening since June 2019. Notes a remote history of IBS that was not severe, and has noted mild increase in symptoms during the year she attributed to stress. However, now with nocturnal symptoms, overt bleeding, and worsening abdominal pain. Labs all unrevealing thus far without anemia, leukocytosis, and stool cultures, O&P, CDI negative. Heme positive through PCP. No prior colonoscopy. No family history of IBD. Possible history of colon polyps in father at unknown age. She endorses occasional use of NSAIDs earlier this year but has since stopped. Due to continued pain, tenderness on exam, pursuing CT now. Ultimately arranging colonoscopy in the near future for diagnostic purposes.  CT abd/pelvis today Proceed with colonoscopy with Dr. Darrick PennaFields in the near future. The risks, benefits, and alternatives have been discussed in detail with the patient. They state understanding and desire to proceed.  Continue to avoid NSAIDs

## 2017-09-24 NOTE — Assessment & Plan Note (Signed)
CT as planned.  

## 2017-09-24 NOTE — Progress Notes (Addendum)
REVIEWED-NO ADDITIONAL RECOMMENDATIONS.  Primary Care Physician:  [name], MD Primary Gastroenterologist:  Dr. Darrick Penna   Chief Complaint  Patient presents with  . Diarrhea  . Abdominal Pain    HPI:   Amy Russell is a 37 y.o. female presenting today at the request of Dr. Gerda Diss secondary to diarrhea. Recently heme positive through their office. Work-up thus far: negative TTg, IgA, normal sed rate, HFP, lipase, BMP, CBC,  Negative O&P, CDI PCR negative, negative stool cultures.   Notes a history of IBS in the remote past. 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 Brother, age 55, also suffered a heart attack while on a cruise earlier this year and now on the heart transplant. Notes she was having postprandial urgency at that time but milder. Notes flare of symptoms in June 2019, waking her up in the middle of the night to have diarrhea. Has to be careful with eating due to postprandial diarrhea. Not always postprandial. Rare formed stool. Will have up to 10 loose stools a day at the most. Has noted intermittent rectal bleeding now. Hasn't noticed large amounts. Sometimes obvious, sometimes not. Notes mucus as well. No significant rectal discomfort. Notes abdominal pain. Associated abdominal cramping. Sometimes a dull pain. Doesn't feel good or feel like doing anything. No weight loss. Eating a bland diet. Associated nausea, mild vomiting. Terrible reflux issues. Had been taking NSAIDs occasionally but stopped at direction of PCP. Zantac prn for reflux. Not helpful. Abdominal pain a 6/10 at times.   Past Medical History:  Diagnosis Date  . Allergy   . Chronic urticaria 09/07/2017    Past Surgical History:  Procedure Laterality Date  . CHOLECYSTECTOMY     in college  . MANDIBLE SURGERY     as a teenager    Current Outpatient Medications  Medication Sig Dispense Refill  . albuterol (PROVENTIL HFA;VENTOLIN HFA) 108 (90 Base) MCG/ACT inhaler Inhale 2 puffs into  the lungs every 6 (six) hours as needed for wheezing. 1 Inhaler 2  . cetirizine (ZYRTEC) 10 MG tablet Take 10 mg by mouth daily.    . diphenhydrAMINE (BENADRYL) 25 mg capsule Take 25 mg by mouth every 6 (six) hours as needed.    . fluticasone (FLOVENT HFA) 110 MCG/ACT inhaler Inhale 2 puffs into the lungs 2 (two) times daily. (Patient taking differently: Inhale 2 puffs into the lungs as needed. ) 1 Inhaler 5  . hydrocortisone 2.5 % cream One bid to rash 60 g 2  . montelukast (SINGULAIR) 10 MG tablet One po QHS 30 tablet 6  . OCELLA 3-0.03 MG tablet daily.     . ondansetron (ZOFRAN) 4 MG tablet Take 1 tablet (4 mg total) by mouth every 8 (eight) hours as needed for nausea or vomiting. 30 tablet 1  . pantoprazole (PROTONIX) 40 MG tablet Take 1 tablet (40 mg total) by mouth daily. Take 30 minutes before breakfast 90 tablet 3   No current facility-administered medications for this visit.     Allergies as of 09/24/2017  . (No Known Allergies)    Family History  Problem Relation Age of Onset  . Colon polyps Father        possibly, unknown age   . Pancreatitis Mother        ?pancreas divisum  . Diverticulitis Sister   . Colon cancer Neg Hx   . Crohn's disease Neg Hx   . Ulcerative colitis Neg Hx     Social History  Socioeconomic History  . Marital status: Single    Spouse name: Not on file  . Number of children: Not on file  . Years of education: Not on file  . Highest education level: Not on file  Occupational History  . Occupation: [employer]    Comment: teacher   Social Needs  . Financial resource strain: Not on file  . Food insecurity:    Worry: Not on file    Inability: Not on file  . Transportation needs:    Medical: Not on file    Non-medical: Not on file  Tobacco Use  . Smoking status: Never Smoker  . Smokeless tobacco: Never Used  Substance and Sexual Activity  . Alcohol use: Never    Frequency: Never  . Drug use: Never  . Sexual activity: Not on file    Lifestyle  . Physical activity:    Days per week: Not on file    Minutes per session: Not on file  . Stress: Not on file  Relationships  . Social connections:    Talks on phone: Not on file    Gets together: Not on file    Attends religious service: Not on file    Active member of club or organization: Not on file    Attends meetings of clubs or organizations: Not on file    Relationship status: Not on file  . Intimate partner violence:    Fear of current or ex partner: Not on file    Emotionally abused: Not on file    Physically abused: Not on file    Forced sexual activity: Not on file  Other Topics Concern  . Not on file  Social History Narrative  . Not on file    Review of Systems: Gen: Denies any fever, chills, fatigue, weight loss, lack of appetite.  CV: Denies chest pain, heart palpitations, peripheral edema, syncope.  Resp: Denies shortness of breath at rest or with exertion. Denies wheezing or cough.  GI: see HPI  GU : Denies urinary burning, urinary frequency, urinary hesitancy MS: Denies joint pain, muscle weakness, cramps, or limitation of movement.  Derm: Denies rash, itching, dry skin Psych: Denies depression, anxiety, memory loss, and confusion Heme: see HPI   Physical Exam: BP 133/88   Pulse 91   Temp 97.7 F (36.5 C) (Oral)   Ht 5' 7.5" (1.715 m)   Wt 240 lb (108.9 kg)   LMP 09/12/2017 (Exact Date)   BMI 37.03 kg/m  General:   Alert and oriented. Pleasant and cooperative. Well-nourished and well-developed.  Head:  Normocephalic and atraumatic. Eyes:  Without icterus, sclera clear and conjunctiva pink.  Ears:  Normal auditory acuity. Nose:  No deformity, discharge,  or lesions. Mouth:  No deformity or lesions, oral mucosa pink.  Lungs:  Clear to auscultation bilaterally. No wheezes, rales, or rhonchi. No distress.  Heart:  S1, S2 present without murmurs appreciated.  Abdomen:  +BS, soft, TTP LLQ and lower abdomen and non-distended. No HSM noted.  No guarding or rebound. No masses appreciated.  Rectal:  Deferred  Msk:  Symmetrical without gross deformities. Normal posture. Extremities:  Without edema. Neurologic:  Alert and  oriented x4 Psych:  Alert and cooperative. Normal mood and affect.  Lab Results  Component Value Date   WBC 10.3 09/07/2017   HGB 13.7 09/07/2017   HCT 42.8 09/07/2017   MCV 87 09/07/2017   PLT 310 09/07/2017   Lab Results  Component Value Date   ALT 10  09/07/2017   AST 14 09/07/2017   ALKPHOS 99 09/07/2017   BILITOT <0.2 09/07/2017   Lab Results  Component Value Date   CREATININE 0.83 09/07/2017   BUN 13 09/07/2017   NA 139 09/07/2017   K 4.5 09/07/2017   CL 101 09/07/2017   CO2 24 09/07/2017

## 2017-09-24 NOTE — Assessment & Plan Note (Signed)
Start Protonix once daily. Intermittent nausea, rare vomiting may be associated with uncontrolled GERD and/or current lower GI issues. No EGD needed at this time.

## 2017-09-24 NOTE — Patient Instructions (Signed)
I have sent Protonix to pharmacy to take once each morning, 30 minutes before breakfast for reflux. It works best when taken on an empty stomach.  I have also sent some nausea medication to the pharmacy to use as needed.   We have ordered a stat CT scan today.  Colonoscopy will be in the near future with Dr. Darrick PennaFields.  It was a pleasure to see you today. I strive to create trusting relationships with patients to provide genuine, compassionate, and quality care. I value your feedback. If you receive a survey regarding your visit,  I greatly appreciate you taking time to fill this out.   Gelene MinkAnna W. Boone, PhD, ANP-BC [facility]

## 2017-09-29 ENCOUNTER — Telehealth: Payer: Self-pay | Admitting: General Practice

## 2017-09-29 MED ORDER — PROMETHAZINE HCL 25 MG PO TABS: ORAL_TABLET | ORAL | 0 refills | Status: DC

## 2017-09-29 NOTE — Telephone Encounter (Signed)
Patient called in stating she has been taking the Zofran every 8 hours prescribed for nausea by Tobi BastosAnna.    However,it is not helping and she is headed out of town tomorrow and wanted to know if there was something else she could take.  She is also taking the Bentyl and it seems to help with her abd pain.  She had a low grade fever yesterday(100)with some lower abd pain.  She denies any rectal bleeding or vomiting.  Patient can be reached at [phone]  Routing to MartintonLeslie for advice

## 2017-09-29 NOTE — Telephone Encounter (Signed)
Spoke with pt. She says shes had a low grade fever off and on for almost 1 week. Pt has a fever on Sunday. Not getting above 100, Monday no fever, Tuesday fever returned. Pt does want the Phenergan called into [facility] [address]

## 2017-09-29 NOTE — Telephone Encounter (Signed)
Helmut Musterlicia, please contact patient for more information about low grade fever. Is she still having this? Any urinary symptoms?  As far as her nausea, I would like her to increase pantoprazole to 40mg  BID before breakfast and evening meal for the next week then go back to once daily.    We can try phenergan in place of zofran but it can be sedating and she shouldn't drive while taking it. RX sent into pharmacy.   SAVE FOR AB REVIEW UPON HER RETURN.

## 2017-09-29 NOTE — Addendum Note (Signed)
Addended by: Tiffany KocherLEWIS, [place] Mullett S on: 09/29/2017 03:53 PM   Modules accepted: Orders

## 2017-09-30 MED ORDER — PROMETHAZINE HCL 25 MG PO TABS: ORAL_TABLET | ORAL | 0 refills | Status: DC

## 2017-09-30 NOTE — Telephone Encounter (Addendum)
RX sent for phenergan to [facility] today. I HAD ALREADY SENT TO [facility] WAS LISTED AS HER PHARMACY.   I tried to call patient to get more info regarding low grade fever.   Amy Russell, did you find out if patient having any urinary symptoms as requested?

## 2017-09-30 NOTE — Telephone Encounter (Signed)
Pt said she is feeling better. She does not have a low grade fever now. It was 2 different days, Sunday afternoon 99.5 and Tuesday 100.0. She does not have any urinary symptoms or sinus problems. Just occasionally her intermittent abdominal pain. She is fine now and the phenergan was delivered from [facility] ( which is her pharmacy). She requested [facility] when she thought it was too late for [facility] to deliver.  I will call [facility] and cancel the order.

## 2017-09-30 NOTE — Telephone Encounter (Signed)
Thanks. Save for AB review.

## 2017-09-30 NOTE — Telephone Encounter (Signed)
I called [facility] and spoke to [place] and had the prescription cancelled for the phenergan.

## 2017-09-30 NOTE — Addendum Note (Signed)
Addended by: Tiffany KocherLEWIS, Mareli Antunes S on: 09/30/2017 11:00 AM   Modules accepted: Orders

## 2017-10-05 NOTE — Telephone Encounter (Signed)
Hopefully, she is doing better now. Keep plans for colonoscopy.

## 2017-10-06 NOTE — Telephone Encounter (Signed)
Pt said she is feeling better and she will keep plans for colonoscopy.

## 2017-10-08 ENCOUNTER — Telehealth: Payer: Self-pay

## 2017-10-08 NOTE — Telephone Encounter (Signed)
Called pt, TCS for 10/12/17 moved up to 1:30pm. Pt to arrive at 12:30pm. Advised her to start drinking 2nd half of prep 10/12/17 at 8:30am, NPO after 10:30am. Endo scheduler aware.

## 2017-10-12 ENCOUNTER — Ambulatory Visit (HOSPITAL_COMMUNITY)
Admission: RE | Admit: 2017-10-12 | Discharge: 2017-10-12 | Disposition: A | Discharge status: Home / Self Care | Payer: BC Managed Care – PPO | Source: Ambulatory Visit | Attending: Gastroenterology | Admitting: Gastroenterology

## 2017-10-12 ENCOUNTER — Encounter (HOSPITAL_COMMUNITY)
Admission: RE | Disposition: A | Discharge status: Home / Self Care | Payer: Self-pay | Source: Ambulatory Visit | Attending: Gastroenterology

## 2017-10-12 ENCOUNTER — Other Ambulatory Visit: Payer: Self-pay

## 2017-10-12 ENCOUNTER — Encounter (HOSPITAL_COMMUNITY): Payer: Self-pay | Admitting: *Deleted

## 2017-10-12 DIAGNOSIS — Z79899 Other long term (current) drug therapy: Secondary | ICD-10-CM | POA: Insufficient documentation

## 2017-10-12 DIAGNOSIS — K6289 Other specified diseases of anus and rectum: Secondary | ICD-10-CM | POA: Insufficient documentation

## 2017-10-12 DIAGNOSIS — R1030 Lower abdominal pain, unspecified: Secondary | ICD-10-CM | POA: Diagnosis not present

## 2017-10-12 DIAGNOSIS — K648 Other hemorrhoids: Secondary | ICD-10-CM | POA: Diagnosis not present

## 2017-10-12 DIAGNOSIS — R197 Diarrhea, unspecified: Secondary | ICD-10-CM | POA: Diagnosis not present

## 2017-10-12 DIAGNOSIS — K219 Gastro-esophageal reflux disease without esophagitis: Secondary | ICD-10-CM | POA: Insufficient documentation

## 2017-10-12 DIAGNOSIS — K529 Noninfective gastroenteritis and colitis, unspecified: Secondary | ICD-10-CM | POA: Insufficient documentation

## 2017-10-12 DIAGNOSIS — K921 Melena: Secondary | ICD-10-CM | POA: Insufficient documentation

## 2017-10-12 DIAGNOSIS — Q438 Other specified congenital malformations of intestine: Secondary | ICD-10-CM | POA: Insufficient documentation

## 2017-10-12 DIAGNOSIS — K625 Hemorrhage of anus and rectum: Secondary | ICD-10-CM

## 2017-10-12 HISTORY — DX: Gastro-esophageal reflux disease without esophagitis: K21.9

## 2017-10-12 HISTORY — PX: COLONOSCOPY: SHX5424

## 2017-10-12 SURGERY — COLONOSCOPY
Anesthesia: Moderate Sedation

## 2017-10-12 MED ORDER — ONDANSETRON HCL 4 MG/2ML IJ SOLN
INTRAMUSCULAR | Status: AC
  Filled 2017-10-12: qty 2

## 2017-10-12 MED ORDER — MIDAZOLAM HCL 5 MG/5ML IJ SOLN
INTRAMUSCULAR | Status: AC
  Filled 2017-10-12: qty 10

## 2017-10-12 MED ORDER — SODIUM CHLORIDE 0.9 % IV SOLN
INTRAVENOUS | Status: DC
  Administered 2017-10-12: 13:00:00 via INTRAVENOUS

## 2017-10-12 MED ORDER — MIDAZOLAM HCL 5 MG/5ML IJ SOLN
INTRAMUSCULAR | Status: DC | PRN
  Administered 2017-10-12 (×4): 2 mg via INTRAVENOUS

## 2017-10-12 MED ORDER — ONDANSETRON HCL 4 MG/2ML IJ SOLN: 4.0000 mg | Freq: Once | INTRAMUSCULAR | Status: DC

## 2017-10-12 MED ORDER — MEPERIDINE HCL 100 MG/ML IJ SOLN
INTRAMUSCULAR | Status: AC
  Filled 2017-10-12: qty 2

## 2017-10-12 MED ORDER — SIMETHICONE 40 MG/0.6ML PO SUSP
ORAL | Status: DC | PRN
  Administered 2017-10-12: 15:00:00

## 2017-10-12 MED ORDER — ONDANSETRON HCL 4 MG/2ML IJ SOLN
INTRAMUSCULAR | Status: DC | PRN
  Administered 2017-10-12: 4 mg via INTRAVENOUS

## 2017-10-12 MED ORDER — MEPERIDINE HCL 100 MG/ML IJ SOLN
INTRAMUSCULAR | Status: DC | PRN
  Administered 2017-10-12: 50 mg
  Administered 2017-10-12 (×2): 25 mg

## 2017-10-12 NOTE — Discharge Instructions (Signed)
You have moderate size internal hemorrhoids. YOU DID NOT HAVE ANY POLYPS.   DRINK WATER TO KEEP YOUR URINE LIGHT YELLOW.  FOLLOW A HIGH FIBER DIET. AVOID ITEMS THAT CAUSE BLOATING. SEE INFO BELOW.   CONTINUE BENTYL 10 MG 30 MINUTES PRIOR TO BREAKFAST, LUNCH, supper, and at bedtime. IT MAY CAUSE DRY MOUTH/EYES, DROWSINESS, DIFFICULTY URINATING, OR BLURRY VISION.  USE PREPARATION H FOUR TIMES  A DAY IF NEEDED TO RELIEVE RECTAL PAIN/PRESSURE/BLEEDING.   FOLLOW UP IN 3 MOS.   Next colonoscopy in 10 years.  Colonoscopy Care After Read the instructions outlined below and refer to this sheet in the next week. These discharge instructions provide you with general information on caring for yourself after you leave the hospital. While your treatment has been planned according to the most current medical practices available, unavoidable complications occasionally occur. If you have any problems or questions after discharge, call DR. Emelin Dascenzo, [phone].  ACTIVITY  You may resume your regular activity, but move at a slower pace for the next 24 hours.   Take frequent rest periods for the next 24 hours.   Walking will help get rid of the air and reduce the bloated feeling in your belly (abdomen).   No driving for 24 hours (because of the medicine (anesthesia) used during the test).   You may shower.   Do not sign any important legal documents or operate any machinery for 24 hours (because of the anesthesia used during the test).    NUTRITION  Drink plenty of fluids.   You may resume your normal diet as instructed by your doctor.   Begin with a light meal and progress to your normal diet. Heavy or fried foods are harder to digest and may make you feel sick to your stomach (nauseated).   Avoid alcoholic beverages for 24 hours or as instructed.    MEDICATIONS  You may resume your normal medications.   WHAT YOU CAN EXPECT TODAY  Some feelings of bloating in the abdomen.    Passage of more gas than usual.   Spotting of blood in your stool or on the toilet paper  .  IF YOU HAD POLYPS REMOVED DURING THE COLONOSCOPY:  Eat a soft diet IF YOU HAVE NAUSEA, BLOATING, ABDOMINAL PAIN, OR VOMITING.    FINDING OUT THE RESULTS OF YOUR TEST Not all test results are available during your visit. DR. Darrick PennaFIELDS WILL CALL YOU WITHIN 14 DAYS OF YOUR PROCEDUE WITH YOUR RESULTS. Do not assume everything is normal if you have not heard from DR. Andilynn Delavega, CALL HER OFFICE AT [phone].  SEEK IMMEDIATE MEDICAL ATTENTION AND CALL THE OFFICE: [phone] IF:  You have more than a spotting of blood in your stool.   Your belly is swollen (abdominal distention).   You are nauseated or vomiting.   You have a temperature over 101F.   You have abdominal pain or discomfort that is severe or gets worse throughout the day.  High-Fiber Diet A high-fiber diet changes your normal diet to include more whole grains, legumes, fruits, and vegetables. Changes in the diet involve replacing refined carbohydrates with unrefined foods. The calorie level of the diet is essentially unchanged. The Dietary Reference Intake (recommended amount) for adult males is 38 grams per day. For adult females, it is 25 grams per day. Pregnant and lactating women should consume 28 grams of fiber per day. Fiber is the intact part of a plant that is not broken down during digestion. Functional fiber is fiber that has  been isolated from the plant to provide a beneficial effect in the body. PURPOSE  Increase stool bulk.   Ease and regulate bowel movements.   Lower cholesterol.   REDUCE RISK OF COLON CANCER  INDICATIONS THAT YOU NEED MORE FIBER  Constipation and hemorrhoids.   Uncomplicated diverticulosis (intestine condition) and irritable bowel syndrome.   Weight management.   As a protective measure against hardening of the arteries (atherosclerosis), diabetes, and cancer.   GUIDELINES FOR INCREASING  FIBER IN THE DIET  Start adding fiber to the diet slowly. A gradual increase of about 5 more grams (2 slices of whole-wheat bread, 2 servings of most fruits or vegetables, or 1 bowl of high-fiber cereal) per day is best. Too rapid an increase in fiber may result in constipation, flatulence, and bloating.   Drink enough water and fluids to keep your urine clear or pale yellow. Water, juice, or caffeine-free drinks are recommended. Not drinking enough fluid may cause constipation.   Eat a variety of high-fiber foods rather than one type of fiber.   Try to increase your intake of fiber through using high-fiber foods rather than fiber pills or supplements that contain small amounts of fiber.   The goal is to change the types of food eaten. Do not supplement your present diet with high-fiber foods, but replace foods in your present diet.    INCLUDE A VARIETY OF FIBER SOURCES  Replace refined and processed grains with whole grains, canned fruits with fresh fruits, and incorporate other fiber sources. White rice, white breads, and most bakery goods contain little or no fiber.   Brown whole-grain rice, buckwheat oats, and many fruits and vegetables are all good sources of fiber. These include: broccoli, Brussels sprouts, cabbage, cauliflower, beets, sweet potatoes, white potatoes (skin on), carrots, tomatoes, eggplant, squash, berries, fresh fruits, and dried fruits.   Cereals appear to be the richest source of fiber. Cereal fiber is found in whole grains and bran. Bran is the fiber-rich outer coat of cereal grain, which is largely removed in refining. In whole-grain cereals, the bran remains. In breakfast cereals, the largest amount of fiber is found in those with "bran" in their names. The fiber content is sometimes indicated on the label.   You may need to include additional fruits and vegetables each day.   In baking, for 1 cup white flour, you may use the following substitutions:   1 cup  whole-wheat flour minus 2 tablespoons.   1/2 cup white flour plus 1/2 cup whole-wheat flour.   Hemorrhoids Hemorrhoids are dilated (enlarged) veins around the rectum. Sometimes clots will form in the veins. This makes them swollen and painful. These are called thrombosed hemorrhoids. Causes of hemorrhoids include:  Constipation.   Straining to have a bowel movement.   HEAVY LIFTING  HOME CARE INSTRUCTIONS  Eat a well balanced diet and drink 6 to 8 glasses of water every day to avoid constipation. You may also use a bulk laxative.   Avoid straining to have bowel movements.   Keep anal area dry and clean.   Do not use a donut shaped pillow or sit on the toilet for long periods. This increases blood pooling and pain.   Move your bowels when your body has the urge; this will require less straining and will decrease pain and pressure.

## 2017-10-12 NOTE — Op Note (Signed)
[facility] Patient Name: Amy BattenHeather Wilson Procedure Date: 10/12/2017 1:23 PM MRN: [id number] Date of Birth: Oct 24, 1980 Attending MD: Jonette EvaSandi Brendolyn Stockley MD, MD CSN: [id number] Age: [age] Admit Type: Outpatient Procedure:                Colonoscopy with COLD FORCEPS BIOPSY Indications:              Lower abdominal pain, Diarrhea, Hematochezia Providers:                Jonette EvaSandi Isaiah Cianci MD, MD, Jannett CelestineAnitra Bell, RN, Dyann Ruddleonya Wilson Referring MD:             Jonna CoupScott A. Luking Medicines:                Ondansetron 4 mg IV, Meperidine 100 mg IV,                            Midazolam 8 mg IV Complications:            No immediate complications. Estimated Blood Loss:     Estimated blood loss was minimal. Procedure:                Pre-Anesthesia Assessment:                           - Prior to the procedure, a History and Physical                            was performed, and patient medications and                            allergies were reviewed. The patient's tolerance of                            previous anesthesia was also reviewed. The risks                            and benefits of the procedure and the sedation                            options and risks were discussed with the patient.                            All questions were answered, and informed consent                            was obtained. Prior Anticoagulants: The patient has                            taken no previous anticoagulant or antiplatelet                            agents. ASA Grade Assessment: II - A patient with                            mild systemic disease. After reviewing the risks  and benefits, the patient was deemed in                            satisfactory condition to undergo the procedure.                            After obtaining informed consent, the colonoscope                            was passed under direct vision. Throughout the                            procedure, the  patient's blood pressure, pulse, and                            oxygen saturations were monitored continuously. The                            CF-[id number] ([id number]) scope was introduced through                            the anus and advanced to the 10 cm into the ileum.                            The colonoscopy was technically difficult and                            complex due to restricted mobility of the colon.                            Successful completion of the procedure was aided by                            straightening and shortening the scope to obtain                            bowel loop reduction and COLOWRAP. The patient                            tolerated the procedure fairly well. The quality of                            the bowel preparation was excellent. The terminal                            ileum, ileocecal valve, appendiceal orifice, and                            rectum were photographed. Scope In: 3:37:31 PM Scope Out: 3:54:00 PM Scope Withdrawal Time: 0 hours 12 minutes 24 seconds  Total Procedure Duration: 0 hours 16 minutes 29 seconds  Findings:      The terminal ileum appeared normal.      The recto-sigmoid colon, sigmoid colon and descending  colon were       moderately tortuous. Biopsies for histology were taken with a cold       forceps from the cecum, ascending colon, transverse colon, descending       colon and sigmoid colon for evaluation of microscopic colitis.      Internal hemorrhoids were found. The hemorrhoids were moderate. Impression:               - The examined portion of the ileum was normal.                           - Tortuous LEFT colon. Biopsied.                           - Internal hemorrhoids. Moderate Sedation:      Moderate (conscious) sedation was administered by the endoscopy nurse       and supervised by the endoscopist. The following parameters were       monitored: oxygen saturation, heart rate, blood pressure, and response        to care. Total physician intraservice time was 37 minutes. Recommendation:           - Patient has a contact number available for                            emergencies. The signs and symptoms of potential                            delayed complications were discussed with the                            patient. Return to normal activities tomorrow.                            Written discharge instructions were provided to the                            patient.                           - High fiber diet.                           - Continue present medications.                           - Await pathology results.                           - Repeat colonoscopy in 10 years for surveillance.                           - Return to my office in 3 months. Procedure Code(s):        --- Professional ---                           [phone], Colonoscopy, flexible; with biopsy, single  or multiple                           G0500, Moderate sedation services provided by the                            same physician or other qualified health care                            professional performing a gastrointestinal                            endoscopic service that sedation supports,                            requiring the presence of an independent trained                            observer to assist in the monitoring of the                            patient's level of consciousness and physiological                            status; initial 15 minutes of intra-service time;                            patient age 39 years or older (additional time may                            be reported with 16109, as appropriate)                           [phone], Moderate sedation services provided by the                            same physician or other qualified health care                            professional performing the diagnostic or                             therapeutic service that the sedation supports,                            requiring the presence of an independent trained                            observer to assist in the monitoring of the                            patient's level of consciousness and physiological                            status;  each additional 15 minutes intraservice                            time (List separately in addition to code for                            primary service) Diagnosis Code(s):        --- Professional ---                           K64.8, Other hemorrhoids                           R10.30, Lower abdominal pain, unspecified                           R19.7, Diarrhea, unspecified                           K92.1, Melena (includes Hematochezia)                           Q43.8, Other specified congenital malformations of                            intestine CPT copyright 2017 [employer]. All rights reserved. The codes documented in this report are preliminary and upon coder review may  be revised to meet current compliance requirements. Jonette Eva, MD Jonette Eva MD, MD 10/12/2017 4:17:54 PM This report has been signed electronically. Number of Addenda: 0

## 2017-10-12 NOTE — H&P (Addendum)
Primary Care Physician:  Babs SciaraLuking, Scott A, MD Primary Gastroenterologist:  Dr. Darrick PennaFields  Pre-Procedure History & Physical: HPI:  Amy Russell is a 37 y.o. female here for BRBPR/DIARRHEA: 7-8x/day. No travel or ABX(LEVAQUIN: 3 ROUND OF PREDNISONE). 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  Past Medical History:  Diagnosis Date  . Allergy   . Chronic urticaria 09/07/2017  . GERD (gastroesophageal reflux disease)     Past Surgical History:  Procedure Laterality Date  . CHOLECYSTECTOMY     in college  . MANDIBLE SURGERY     as a teenager    Prior to Admission medications   Medication Sig Start Date End Date Taking? Authorizing Provider  acetaminophen (TYLENOL) 500 MG tablet Take 1,000 mg by mouth every 6 (six) hours as needed (for pain.).   Yes [provider]  albuterol (PROVENTIL HFA;VENTOLIN HFA) 108 (90 Base) MCG/ACT inhaler Inhale 2 puffs into the lungs every 6 (six) hours as needed for wheezing. 08/10/16  Yes Campbell RichesHoskins, Carolyn C, NP  cetirizine (ZYRTEC) 10 MG tablet Take 10 mg by mouth at bedtime.    Yes [provider]  dicyclomine (BENTYL) 10 MG capsule Take 10 mg by mouth 4 (four) times daily.   Yes [provider]  diphenhydrAMINE (BENADRYL) 25 mg capsule Take 25 mg by mouth every 6 (six) hours as needed (hives).    Yes [provider]  hydrocortisone 2.5 % cream One bid to rash Patient taking differently: Apply 1 application topically 2 (two) times daily as needed (hives).  07/15/17  Yes Merlyn AlbertLuking, William S, MD  montelukast (SINGULAIR) 10 MG tablet One po QHS Patient taking differently: Take 10 mg by mouth at bedtime.  09/20/17  Yes Babs SciaraLuking, Scott A, MD  ondansetron (ZOFRAN) 4 MG tablet Take 1 tablet (4 mg total) by mouth every 8 (eight) hours as needed for nausea or vomiting. 09/24/17  Yes Gelene MinkBoone, Anna W, NP  pantoprazole (PROTONIX) 40 MG tablet Take 1 tablet (40 mg total) by mouth daily. Take 30 minutes before breakfast 09/24/17  Yes Gelene MinkBoone,  Anna W, NP  promethazine (PHENERGAN) 25 MG tablet Take 1/2 to 1 tablet every six hours as needed for nausea. Caution: can be sedating. Patient taking differently: Take 12.5-25 mg by mouth every 6 (six) hours as needed for nausea. Caution: can be sedating. 09/30/17  Yes Tiffany KocherLewis, Leslie S, PA-C  SYEDA 3-0.03 MG tablet Take 1 tablet by mouth daily.   Yes [provider]  fluticasone (FLOVENT HFA) 110 MCG/ACT inhaler Inhale 2 puffs into the lungs 2 (two) times daily. Patient taking differently: Inhale 2 puffs into the lungs 2 (two) times daily as needed (for seasonal allergies.).  09/22/16   Babs SciaraLuking, Scott A, MD    Allergies as of 09/24/2017  . (No Known Allergies)    Family History  Problem Relation Age of Onset  . Colon polyps Father        possibly, unknown age   . Pancreatitis Mother        ?pancreas divisum  . Diverticulitis Sister   . Colon cancer Neg Hx   . Crohn's disease Neg Hx   . Ulcerative colitis Neg Hx     Social History   Socioeconomic History  . Marital status: Single    Spouse name: Not on file  . Number of children: Not on file  . Years of education: Not on file  . Highest education level: Not on file  Occupational History  . 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 Financial resource strain: Not on file  . Food insecurity:    Worry: Not on file    Inability: Not on file  . Transportation needs:    Medical: Not on file    Non-medical: Not on file  Tobacco Use  . Smoking status: Never Smoker  . Smokeless tobacco: Never Used  Substance and Sexual Activity  . Alcohol use: Never    Frequency: Never  . Drug use: Never  . Sexual activity: Not on file  Lifestyle  . Physical activity:    Days per week: Not on file    Minutes per session: Not on file  . Stress: Not on file  Relationships  . Social connections:    Talks on phone: Not on file    Gets together: Not on file    Attends religious service: Not on file    Active  member of club or organization: Not on file    Attends meetings of clubs or organizations: Not on file    Relationship status: Not on file  . Intimate partner violence:    Fear of current or ex partner: Not on file    Emotionally abused: Not on file    Physically abused: Not on file    Forced sexual activity: Not on file  Other Topics Concern  . Not on file  Social History Narrative  . Not on file    Review of Systems: See HPI, otherwise negative ROS   Physical Exam: BP 126/74   Pulse 94   Temp 98 F (36.7 C) (Oral)   Resp 16   Ht 5' 7.5" (1.715 m)   Wt 240 lb (108.9 kg)   LMP 09/21/2017   SpO2 98%   BMI 37.03 kg/m  General:   Alert,  pleasant and cooperative in NAD Head:  Normocephalic and atraumatic. Neck:  Supple; Lungs:  Clear throughout to auscultation.    Heart:  Regular rate and rhythm. Abdomen:  Soft, nontender and nondistended. Normal bowel sounds, without guarding, and without rebound.   Neurologic:  Alert and  oriented x4;  grossly normal neurologically.  Impression/Plan:     BRBPR/DIARRHEA  PLAN: TCS TODAY. DISCUSSED PROCEDURE, BENEFITS, & RISKS: < 1% chance of medication reaction, bleeding, perforation, or rupture of spleen/liver.

## 2017-10-14 ENCOUNTER — Encounter: Payer: Self-pay | Admitting: Allergy & Immunology

## 2017-10-14 ENCOUNTER — Ambulatory Visit: Payer: BC Managed Care – PPO | Admitting: Allergy & Immunology

## 2017-10-14 VITALS — BP 130/90 | HR 77 | Temp 97.6°F | Resp 16 | Ht 66.5 in | Wt 238.6 lb

## 2017-10-14 DIAGNOSIS — J3089 Other allergic rhinitis: Secondary | ICD-10-CM

## 2017-10-14 DIAGNOSIS — J302 Other seasonal allergic rhinitis: Secondary | ICD-10-CM | POA: Diagnosis not present

## 2017-10-14 DIAGNOSIS — L508 Other urticaria: Secondary | ICD-10-CM

## 2017-10-14 MED ORDER — FLUTICASONE PROPIONATE 50 MCG/ACT NA SUSP: 1.0000 | Freq: Every day | NASAL | 5 refills | Status: DC

## 2017-10-14 NOTE — Patient Instructions (Addendum)
1. Chronic urticaria - Testing to the most common foods was negative (peanut, sesame, cashew, soy, fish mix, shellfish mix, wheat, milk, casein, egg) - There is a the low positive predictive value of food allergy testing and hence the high possibility of false positives. - In contrast, food allergy testing has a high negative predictive value, therefore if testing is negative we can be relatively assured that they are indeed negative.  - Your history does not have any "red flags" such as fevers, joint pains, or permanent skin changes that would be concerning for a more serious cause of hives.  - We will get some labs to rule out serious causes of hives: alpha gal panel, complete blood count, tryptase level, chronic urticaria panel, ESR, and CRP. - Chronic hives are often times a self limited process and will "burn themselves out" over 6-12 months, although this is not always the case.  - In the meantime, start suppressive dosing of antihistamines:   - Morning: Allegra (fexofenadine) 180-37m (te to wo tablets)   - Evening: Zyrtec (cetirizine) 10-264m(one to two tablets) + Singulair (montelukast) 1024m You can change this dosing at home, decreasing the dose as needed or increasing the dosing as needed.  - If you are not tolerating the medications or are tired of taking them every day, we can start treatment with a monthly injectable medication called Xolair.   2. Seasonal and perennial allergic rhinitis - Testing today showed: trees, weeds, indoor molds, dust mites and cockroach - Avoidance measures provided. - Continue with: Zyrtec (cetirizine) 24m25mblet once daily and Singulair (montelukast) 24mg31mly - Start taking: Flonase (fluticasone) one spray per nostril daily - You can use an extra dose of the antihistamine, if needed, for breakthrough symptoms.  - Consider nasal saline rinses 1-2 times daily to remove allergens from the nasal cavities as well as help with mucous clearance (this is  especially helpful to do before the nasal sprays are given) - Consider allergy shots as a means of long-term control. - Allergy shots "re-train" and "reset" the immune system to ignore environmental allergens and decrease the resulting immune response to those allergens (sneezing, itchy watery eyes, runny nose, nasal congestion, etc).    - Allergy shots improve symptoms in 75-85% of patients.  - We can discuss more at the next appointment if the medications are not working for you.  3. Return in about 2 months (around 12/14/2017) [place] [facility] [facility], hospitalizations, or changes in symptoms. Call us [facility] going to the ED for breathing or allergy symptoms since we might be able to fit you in for a sick visit. Feel free to contact us [place] with any questions, problems, or concerns.  It was a pleasure to meet you today!  Websites that have reliable patient information: 1. American Academy of Asthma, Allergy, and Immunology: www.aaaai.org 2. Food Allergy Research and Education (FARE): foodallergy.org 3. Mothers of Asthmatics: http://www.asthmacommunitynetwork.org 4. American College of Allergy, Asthma, and Immunology: www.aMonthlyElectricBill.co.ukke sure you are registered to vote! If you have moved or changed any of your contact information, you will need to get this updated before voting!       Reducing Pollen Exposure  The American Academy of Allergy, Asthma and Immunology suggests the following steps to reduce your exposure to pollen during allergy seasons.    1. Do not hang sheets or clothing out to dry; pollen may collect on these items. 2. Do not  mow lawns or spend time around freshly cut grass; mowing stirs up pollen. 3. Keep windows closed at night.  Keep car windows closed while driving. 4. Minimize morning activities outdoors, a time when pollen counts are usually at their highest. 5. Stay indoors as much as possible when pollen counts  or humidity is high and on windy days when pollen tends to remain in the air longer. 6. Use air conditioning when possible.  Many air conditioners have filters that trap the pollen spores. 7. Use a HEPA room air filter to remove pollen form the indoor air you breathe.  Control of Mold Allergen   Mold and fungi can grow on a variety of surfaces provided certain temperature and moisture conditions exist.  Outdoor molds grow on plants, decaying vegetation and soil.  The major outdoor mold, Alternaria and Cladosporium, are found in very high numbers during hot and dry conditions.  Generally, a late Summer - Fall peak is seen for common outdoor fungal spores.  Rain will temporarily lower outdoor mold spore count, but counts rise rapidly when the rainy period ends.  The most important indoor molds are Aspergillus and Penicillium.  Dark, humid and poorly ventilated basements are ideal sites for mold growth.  The next most common sites of mold growth are the bathroom and the kitchen.  Indoor (Perennial) Mold Control   Positive indoor molds via skin testing: Fusarium, Aureobasidium (Pullulara) and Rhizopus  1. Maintain humidity below 50%. 2. Clean washable surfaces with 5% bleach solution. 3. Remove sources e.g. contaminated carpets.       Control of House Dust Mite Allergen    House dust mites play a major role in allergic asthma and rhinitis.  They occur in environments with high humidity wherever human skin, the food for dust mites is found. High levels have been detected in dust obtained from mattresses, pillows, carpets, upholstered furniture, bed covers, clothes and soft toys.  The principal allergen of the house dust mite is found in its feces.  A gram of dust may contain 1,000 mites and 250,000 fecal particles.  Mite antigen is easily measured in the air during house cleaning activities.    1. Encase mattresses, including the box spring, and pillow, in an air tight cover.  Seal the zipper  end of the encased mattresses with wide adhesive tape. 2. Wash the bedding in water of 130 degrees Farenheit weekly.  Avoid cotton comforters/quilts and flannel bedding: the most ideal bed covering is the dacron comforter. 3. Remove all upholstered furniture from the bedroom. 4. Remove carpets, carpet padding, rugs, and non-washable window drapes from the bedroom.  Wash drapes weekly or use plastic window coverings. 5. Remove all non-washable stuffed toys from the bedroom.  Wash stuffed toys weekly. 6. Have the room cleaned frequently with a vacuum cleaner and a damp dust-mop.  The patient should not be in a room which is being cleaned and should wait 1 hour after cleaning before going into the room. 7. Close and seal all heating outlets in the bedroom.  Otherwise, the room will become filled with dust-laden air.  An electric heater can be used to heat the room. 8. Reduce indoor humidity to less than 50%.  Do not use a humidifier.  Control of Cockroach Allergen  Cockroach allergen has been identified as an important cause of acute attacks of asthma, especially in urban settings.  There are fifty-five species of cockroach that exist in the [place], however only three, the [place], [place] and [place]  species produce allergen that can affect patients with Asthma.  Allergens can be obtained from fecal particles, egg casings and secretions from cockroaches.    1. Remove food sources. 2. Reduce access to water. 3. Seal access and entry points. 4. Spray runways with 0.5-1% Diazinon or Chlorpyrifos 5. Blow boric acid power under stoves and refrigerator. 6. Place bait stations (hydramethylnon) at feeding sites.  Allergy Shots   Allergies are the result of a chain reaction that starts in the immune system. Your immune system controls how your body defends itself. For instance, if you have an allergy to pollen, your immune system identifies pollen as an invader or allergen. Your immune system  overreacts by producing antibodies called Immunoglobulin E (IgE). These antibodies travel to cells that release chemicals, causing an allergic reaction.  The concept behind allergy immunotherapy, whether it is received in the form of shots or tablets, is that the immune system can be desensitized to specific allergens that trigger allergy symptoms. Although it requires time and patience, the payback can be long-term relief.  How Do Allergy Shots Work?  Allergy shots work much like a vaccine. Your body responds to injected amounts of a particular allergen given in increasing doses, eventually developing a resistance and tolerance to it. Allergy shots can lead to decreased, minimal or no allergy symptoms.  There generally are two phases: build-up and maintenance. Build-up often ranges from three to six months and involves receiving injections with increasing amounts of the allergens. The shots are typically given once or twice a week, though more rapid build-up schedules are sometimes used.  The maintenance phase begins when the most effective dose is reached. This dose is different for each person, depending on how allergic you are and your response to the build-up injections. Once the maintenance dose is reached, there are longer periods between injections, typically two to four weeks.  Occasionally doctors give cortisone-type shots that can temporarily reduce allergy symptoms. These types of shots are different and should not be confused with allergy immunotherapy shots.  Who Can Be Treated with Allergy Shots?  Allergy shots may be a good treatment approach for people with allergic rhinitis (hay fever), allergic asthma, conjunctivitis (eye allergy) or stinging insect allergy.   Before deciding to begin allergy shots, you should consider:  . The length of allergy season and the severity of your symptoms . Whether medications and/or changes to your environment can control your symptoms . Your  desire to avoid long-term medication use . Time: allergy immunotherapy requires a major time commitment . Cost: may vary depending on your insurance coverage  Allergy shots for children age 43 and older are effective and often well tolerated. They might prevent the onset of new allergen sensitivities or the progression to asthma.  Allergy shots are not started on patients who are pregnant but can be continued on patients who become pregnant while receiving them. In some patients with other medical conditions or who take certain common medications, allergy shots may be of risk. It is important to mention other medications you talk to your allergist.   When Will I Feel Better?  Some may experience decreased allergy symptoms during the build-up phase. For others, it may take as long as 12 months on the maintenance dose. If there is no improvement after a year of maintenance, your allergist will discuss other treatment options with you.  If you aren't responding to allergy shots, it may be because there is not enough dose of the allergen in  your vaccine or there are missing allergens that were not identified during your allergy testing. Other reasons could be that there are high levels of the allergen in your environment or major exposure to non-allergic triggers like tobacco smoke.  What Is the Length of Treatment?  Once the maintenance dose is reached, allergy shots are generally continued for three to five years. The decision to stop should be discussed with your allergist at that time. Some people may experience a permanent reduction of allergy symptoms. Others may relapse and a longer course of allergy shots can be considered.  What Are the Possible Reactions?  The two types of adverse reactions that can occur with allergy shots are local and systemic. Common local reactions include very mild redness and swelling at the injection site, which can happen immediately or several hours after. A  systemic reaction, which is less common, affects the entire body or a particular body system. They are usually mild and typically respond quickly to medications. Signs include increased allergy symptoms such as sneezing, a stuffy nose or hives.  Rarely, a serious systemic reaction called anaphylaxis can develop. Symptoms include swelling in the throat, wheezing, a feeling of tightness in the chest, nausea or dizziness. Most serious systemic reactions develop within 30 minutes of allergy shots. This is why it is strongly recommended you wait in your doctor's office for 30 minutes after your injections. Your allergist is trained to watch for reactions, and his or her staff is trained and equipped with the proper medications to identify and treat them.  Who Should Administer Allergy Shots?  The preferred location for receiving shots is your prescribing allergist's office. Injections can sometimes be given at another facility where the physician and staff are trained to recognize and treat reactions, and have received instructions by your prescribing allergist.

## 2017-10-14 NOTE — Progress Notes (Signed)
NEW PATIENT  Date of Service/Encounter:  10/14/17  Referring provider: Kathyrn Drown, MD   Assessment:   Chronic urticaria - in the setting of chronic abdominal pain and negative testing today to the most common food allergens  Seasonal and perennial allergic rhinitis (trees, weeds, indoor molds, dust mites and cockroach)  Plan/Recommendations:   1. Chronic urticaria - Testing to the most common foods was negative (peanut, sesame, cashew, soy, fish mix, shellfish mix, wheat, milk, casein, egg) - There is a the low positive predictive value of food allergy testing and hence the high possibility of false positives. - In contrast, food allergy testing has a high negative predictive value, therefore if testing is negative we can be relatively assured that they are indeed negative.  - Your history does not have any "red flags" such as fevers, joint pains, or permanent skin changes that would be concerning for a more serious cause of hives.  - We will get some labs to rule out serious causes of hives: alpha gal panel, complete blood count, tryptase level, chronic urticaria panel, ESR, and CRP. - Chronic hives are often times a self limited process and will "burn themselves out" over 6-12 months, although this is not always the case.  - In the meantime, start suppressive dosing of antihistamines:   - Morning: Allegra (fexofenadine) 180-375m (te to wo tablets)   - Evening: Zyrtec (cetirizine) 10-253m(one to two tablets) + Singulair (montelukast) 1062m You can change this dosing at home, decreasing the dose as needed or increasing the dosing as needed.  - If you are not tolerating the medications or are tired of taking them every day, we can start treatment with a monthly injectable medication called Xolair.   2. Seasonal and perennial allergic rhinitis - Testing today showed: trees, weeds, indoor molds, dust mites and cockroach - Avoidance measures provided. - Continue with: Zyrtec  (cetirizine) 75m60mblet once daily and Singulair (montelukast) 75mg59mly - Start taking: Flonase (fluticasone) one spray per nostril daily - You can use an extra dose of the antihistamine, if needed, for breakthrough symptoms.  - Consider nasal saline rinses 1-2 times daily to remove allergens from the nasal cavities as well as help with mucous clearance (this is especially helpful to do before the nasal sprays are given) - Consider allergy shots as a means of long-term control. - Allergy shots "re-train" and "reset" the immune system to ignore environmental allergens and decrease the resulting immune response to those allergens (sneezing, itchy watery eyes, runny nose, nasal congestion, etc).    - Allergy shots improve symptoms in 75-85% of patients.  - We can discuss more at the next appointment if the medications are not working for you.  3. Return in about 2 months (around 12/14/2017) in Amy Russell:   Amy Russell 37 y.67 female presenting today for evaluation of  Chief Complaint  Patient presents with  . Establish Care  . Urticaria  . Allergy Testing    Amy Amy Russell history of the following: Patient Active Problem List   Diagnosis Date Noted  . Diarrhea   . Abdominal pain 09/24/2017  . GERD (gastroesophageal reflux disease) 09/24/2017  . Rectal bleeding 09/24/2017  . Chronic urticaria 09/07/2017  . Reactive airway disease 11/25/2015    History obtained from: chart review and patient.   Amy Princereferred by Amy Russell  Gastroenterologist: Amy Russell Amy Russell  is a 37 y.o. female presenting for an evaluation of hives. They started in May 2019. She did not have any breathing problems. They are located on her face as well as her bilateral arms. They are itchy. She treats them with hydrocortisone cream for her face and she will take Benadryl if they become very itchy. This does help somewhat. She denies breathing  problems with the hives. She is unsure of a food trigger. She never had hives chronically in the past.   She does have a history of stomach problems that started in May 2019 with the hives. She would have diarrhea 8 times per day or more. She did have a colonoscopy two days ago. She is seen by Amy Russell. They did do biopsies but the results are pending. She has been taking Bentyl and Protonix with some improvement (this was started two weeks before the colonoscopy). She has not gone to the bathroom in the last few days since she just had the colonoscopy. She does report some reflux symptoms, which is why the Protonix was started. There are no particular foods that trigger the symptoms. She does tolerate all of the major food allergens without adverse event.    She does have some joint pain with these. She has never had fevers with these. Lesions never resolve completely. They are minimally responsive to antihistamines. She has had a complete metabolic panel in the last month that was normal. She had a TTG that was negative as well as an O&P and C diff testing. A CBC was normal as well. She did not have any rheumatology labs performed.   Allergic Rhinitis Symptom History: She does report eye itching and itchy throat for quite some time. This is most predominant in the spring and fall. She is on Singulair and Zyrtec for this. This was started this past spring. She grew up in [place].   She has had some stress in her life. Her brother had a massive heart attack while on a cruise and is now on the list of a heart transplant. Her father had a stroke and then was in car accident. She has had work stress as well.  Otherwise, there is no history of other atopic diseases, including asthma, drug allergies, food allergies, stinging insect allergies, or urticaria. There is no significant infectious history. Vaccinations are up to date.    Past Medical History: Patient Active Problem List   Diagnosis Date  Noted  . Diarrhea   . Abdominal pain 09/24/2017  . GERD (gastroesophageal reflux disease) 09/24/2017  . Rectal bleeding 09/24/2017  . Chronic urticaria 09/07/2017  . Reactive airway disease 11/25/2015    Medication List:  Allergies as of 10/14/2017   No Known Allergies     Medication List        Accurate as of 10/14/17 11:59 PM. Always use your most recent med list.          acetaminophen 500 MG tablet Commonly known as:  TYLENOL Take 1,000 mg by mouth every 6 (six) hours as needed (for pain.).   albuterol 108 (90 Base) MCG/ACT inhaler Commonly known as:  PROVENTIL HFA;VENTOLIN HFA Inhale 2 puffs into the lungs every 6 (six) hours as needed for wheezing.   cetirizine 10 MG tablet Commonly known as:  ZYRTEC Take 10 mg by mouth at bedtime.   dicyclomine 10 MG capsule Commonly known as:  BENTYL Take 10 mg by mouth 4 (four) times daily.   diphenhydrAMINE 25 mg capsule Commonly known as:  BENADRYL Take 25 mg by mouth every 6 (six) hours as needed (hives).   fluticasone 110 MCG/ACT inhaler Commonly known as:  FLOVENT HFA Inhale 2 puffs into the lungs 2 (two) times daily.   fluticasone 50 MCG/ACT nasal spray Commonly known as:  FLONASE Place 1 spray into both nostrils daily.   hydrocortisone 2.5 % cream One bid to rash   montelukast 10 MG tablet Commonly known as:  SINGULAIR One po QHS   ondansetron 4 MG tablet Commonly known as:  ZOFRAN Take 1 tablet (4 mg total) by mouth every 8 (eight) hours as needed for nausea or vomiting.   pantoprazole 40 MG tablet Commonly known as:  PROTONIX Take 1 tablet (40 mg total) by mouth daily. Take 30 minutes before breakfast   promethazine 25 MG tablet Commonly known as:  PHENERGAN Take 1/2 to 1 tablet every six hours as needed for nausea. Caution: can be sedating.   SYEDA 3-0.03 MG tablet Generic drug:  drospirenone-ethinyl estradiol Take 1 tablet by mouth daily.       Birth History: non-contributory. Born at term  without complications.   Developmental History: non-contributory.   Past Surgical History: Past Surgical History:  Procedure Laterality Date  . CHOLECYSTECTOMY     in college  . MANDIBLE SURGERY     as a teenager     Family History: Family History  Problem Relation Age of Onset  . Colon polyps Father        possibly, unknown age   . Pancreatitis Mother        ?pancreas divisum  . Diverticulitis Sister   . Colon cancer Neg Hx   . Crohn's disease Neg Hx   . Ulcerative colitis Neg Hx      Social History: Amy Russell lives at home with her two children (ages 59 and 81). . She teaches at [employer]. There was [place]. She had to freeze this [place] pig and transport her home in a frozen state so that they could bury her in their yard. They live in a house that was built in 1969. There is carpeting in the main living areas and wood flooring in the bedrooms. They have gas heating and central cooling. There are two [place] pigs in the home (they recently had to buy a replacement). There are dust mite coverings on the bed, but only some of the pillows. There is no tobacco exposure in the home. 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     Review of Systems: a 14-point review of systems is pertinent for what is mentioned in HPI.  Otherwise, all other systems were negative. Constitutional: negative other than that listed in the HPI Eyes: negative other than that listed in the HPI Ears, nose, mouth, throat, and face: negative other than that listed in the HPI Respiratory: negative other than that listed in the HPI Cardiovascular: negative other than that listed in the HPI Gastrointestinal: negative other than that listed in the HPI Genitourinary: negative other than that listed in the HPI Integument: negative other than that listed in the HPI Hematologic: negative other than that listed in the HPI Musculoskeletal:  negative other than that listed in the HPI Neurological: negative other than that listed in the HPI Allergy/Immunologic: negative other than that listed in the HPI    Objective:   Blood pressure 130/90, pulse 77, temperature 97.6 F (36.4 C), resp. rate 16, height 5'  6.5" (1.689 m), weight 238 lb 9.6 oz (108.2 kg), last menstrual period 09/21/2017, SpO2 97 %. Body mass index is 37.93 kg/m.   Physical Exam:  General: Alert, interactive, in no acute distress. Pleasant obese female.  Eyes: No conjunctival injection bilaterally, no discharge on the right, no discharge on the left and no Horner-Trantas dots present. PERRL bilaterally. EOMI without pain. No photophobia.  Ears: Right TM pearly gray with normal light reflex, Left TM pearly gray with normal light reflex, Right TM intact without perforation and Left TM intact without perforation.  Nose/Throat: External nose within normal limits and septum midline. Turbinates markedly edematous with clear discharge. Posterior oropharynx erythematous with cobblestoning in the posterior oropharynx. Tonsils 2+ without exudates.  Tongue without thrush. Neck: Supple without thyromegaly. Trachea midline. Adenopathy: no enlarged lymph nodes appreciated in the anterior cervical, occipital, axillary, epitrochlear, inguinal, or popliteal regions. Lungs: Clear to auscultation without wheezing, rhonchi or rales. No increased work of breathing. CV: Normal S1/S2. No murmurs. Capillary refill <2 seconds.  Abdomen: Nondistended, nontender. No guarding or rebound tenderness. Bowel sounds present in all fields and hyperactive  Skin: Warm and dry, without lesions or rashes. Extremities:  No clubbing, cyanosis or edema. Neuro:   Grossly intact. No focal deficits appreciated. Responsive to questions.  Diagnostic studies:   Allergy Studies:   Indoor/Outdoor Percutaneous Adult Environmental Panel: negative to the entire panel with adequate controls.  Indoor/Outdoor  Selected Intradermal Environmental Panel: positive to ragweed mix, tree mix, mold mix #4, cockroach and mite mix. Otherwise negative with adequate controls.  Most Common Foods Panel (peanut, cashew, soy, fish mix, shellfish mix, wheat, milk, egg): negative to all with adequate controls.    Allergy testing results were read and interpreted by myself, documented by clinical staff.       Salvatore Marvel, MD [facility]

## 2017-10-15 ENCOUNTER — Encounter: Payer: Self-pay | Admitting: Allergy & Immunology

## 2017-10-18 NOTE — Progress Notes (Signed)
Amy Russell, can you please review and advise in Dr. Evelina DunField's absence.

## 2017-10-19 NOTE — Progress Notes (Signed)
Pt is aware and said she is not having much pain now, but still has about 2 episodes of diarrhea daily, not watery, just very loose.

## 2017-10-20 ENCOUNTER — Encounter: Payer: Self-pay | Admitting: Gastroenterology

## 2017-10-20 ENCOUNTER — Telehealth: Payer: Self-pay | Admitting: Family Medicine

## 2017-10-20 ENCOUNTER — Telehealth: Payer: Self-pay | Admitting: Gastroenterology

## 2017-10-20 LAB — CBC WITH DIFFERENTIAL/PLATELET
BASOS ABS: 0 10*3/uL (ref 0.0–0.2)
Basos: 0 %
EOS (ABSOLUTE): 0.1 10*3/uL (ref 0.0–0.4)
EOS: 1 %
HEMATOCRIT: 40.5 % (ref 34.0–46.6)
Hemoglobin: 13.2 g/dL (ref 11.1–15.9)
IMMATURE GRANS (ABS): 0 10*3/uL (ref 0.0–0.1)
IMMATURE GRANULOCYTES: 0 %
LYMPHS: 23 %
Lymphocytes Absolute: 2.4 10*3/uL (ref 0.7–3.1)
MCH: 28.4 pg (ref 26.6–33.0)
MCHC: 32.6 g/dL (ref 31.5–35.7)
MCV: 87 fL (ref 79–97)
MONOCYTES: 3 %
Monocytes Absolute: 0.3 10*3/uL (ref 0.1–0.9)
Neutrophils Absolute: 7.8 10*3/uL — ABNORMAL HIGH (ref 1.4–7.0)
Neutrophils: 73 %
Platelets: 317 10*3/uL (ref 150–450)
RBC: 4.64 x10E6/uL (ref 3.77–5.28)
RDW: 14.6 % (ref 12.3–15.4)
WBC: 10.6 10*3/uL (ref 3.4–10.8)

## 2017-10-20 LAB — H PYLORI, IGM, IGG, IGA AB
H pylori, IgM Abs: <9 units (ref 0.0–8.9)
H. pylori, IgA Abs: <9 units (ref 0.0–8.9)
H. pylori, IgG AbS: <0.8 Index Value (ref 0.00–0.79)

## 2017-10-20 LAB — ALPHA-GAL PANEL
Alpha Gal IgE*: <0.1 kU/L (ref <0.10)
Class Interpretation: 0
Class Interpretation: 0
LAMB CLASS INTERPRETATION: 0
Pork (Sus spp) IgE: <0.1 kU/L (ref <0.35)

## 2017-10-20 LAB — C-REACTIVE PROTEIN: CRP: 25 mg/L — AB (ref 0–10)

## 2017-10-20 LAB — THYROID ANTIBODIES
Thyroglobulin Antibody: <1 IU/mL (ref 0.0–0.9)
Thyroperoxidase Ab SerPl-aCnc: 10 IU/mL (ref 0–34)

## 2017-10-20 LAB — CHRONIC URTICARIA: cu index: <1.2 (ref <10)

## 2017-10-20 LAB — ANA: ANA: NEGATIVE

## 2017-10-20 LAB — SEDIMENTATION RATE: SED RATE: 63 mm/h — AB (ref 0–32)

## 2017-10-20 NOTE — Telephone Encounter (Signed)
BIOPSIES SHOW MILD PROCTOCOLITIS ANDLIKLEY DUE TO NSAIDs, LESS LIKELY EARLY IBD. STARTING BACK TO WORK AND SHE'S A LITTLE NERVOUS. BENTYL HELPED SOME. YESTERDAY WAS AT [employer] AND DIDN'T EAT AND SHE WAS OK.  BMs: SOFT STOOL A LITTLE AND TAKING BENTYL BEFORE MEALS AND SOMETIMES WITH MEALS. YESTERDAY AND 2 AND NORMALLY 3-4. STARTS SCHOOL MON.   PLAN: 1. TAKE BENTYL 30 MINS BEFORE A MEAL. 2. ADD TUMS ONE WITH MEALS 3. LOW FAT/LACTOSE FREE DIET-LOOK UP INFO AT [email or web address] 4. PICK UP A NOTE AFTER 12N AUG 15. 5. CALL IN 2 WEEKS IF SYMPTOMS ARE NOT BETTER. [facility]

## 2017-10-20 NOTE — Telephone Encounter (Signed)
Nurses- let the patient know that I reviewed over her colonoscopy and her biopsy.  The biopsy did show colitis which is what is contributing to her diarrhea Imodium can help Let patient know that we will discuss with gastroenterology to see if they have other recommendations Nurses-please let me speak with Lewie LoronAnna Boone who originally saw the patient

## 2017-10-20 NOTE — Telephone Encounter (Signed)
Patient is aware of all. 

## 2017-10-20 NOTE — Telephone Encounter (Signed)
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  She said she didn't eat much yesterday because [facility].  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   She wants to know what she needs to do as far as diet, eat or not eat fiber, etc.  I told her Dr. Darrick PennaFields is back today and will make recommendations.  She said that is fine.

## 2017-10-20 NOTE — Telephone Encounter (Signed)
Patient is still having issues with diarrhea and she [facility] didn't really give her much of a follow up after her colonoscopy.  She wants to know what Dr. Lorin PicketScott recommends?

## 2017-10-20 NOTE — Progress Notes (Signed)
Noted. Dr. Darrick PennaFields is back. Will await final recommendations.

## 2017-10-20 NOTE — Telephone Encounter (Signed)
Nurses-please call patient-inform her of the following-please let the patient know I spoke with Lewie LoronAnna Boone nurse practitioner with Dr. Darrick Pennafields.  We discussed the colonoscopy we also discussed the pathology showing colitis.  We also discussed how the patient is having ongoing diarrhea.  I have asked Tobi Bastosnna to discuss the case with Dr. Darrick Pennafields and to call the patient back to discuss with her proper dietary recommendations as well as medication recommendations in order to get her condition under much better control.  I am hoping that with a try at this point will be of help if any other troubles please have the patient let me know

## 2017-10-20 NOTE — Telephone Encounter (Signed)
I spoke with the patient and she states she is still having issues with diarrhea. She states she is doing better than before,but she needs to know what she should be doing for this. She states she called Dr.Fields office and talked with them and they were of no help. She states she is not impressed with her as she acted non to concerned about it. She has been taking imodium,but did not know if this is what she should be doing.She wants to know your recommendations as to whether she needed to be on a bland diet as they initially was told or a high fiber diet. She is concerned as she has to go back to school on Monday and she is afraid to eat anything. Please advise. She is not running any fevers and no other concerns at this time.

## 2017-10-20 NOTE — Telephone Encounter (Signed)
Called [facility] and spoke with Darl PikesSusan she took the message to have the Tobi Bastosnna to call us either at the office number or Dr. Lorin PicketScott Luking's cell #

## 2017-10-20 NOTE — Telephone Encounter (Signed)
I called left a message to ask for more information. Asked that she r/c.

## 2017-10-20 NOTE — Telephone Encounter (Signed)
I spoke with Dr. Gerda DissLuking about mutual patient. When Doris touched base with her yesterday, she was doing better clinically.  Doris: can you touch base with her and let her know that we are just reviewing final recommendations with Dr. Darrick PennaFields? The pathology was non-specific, and she had been on NSAIDs in the past. This is why we are reviewing for what she feels is best for patient.

## 2017-10-21 ENCOUNTER — Encounter: Payer: Self-pay | Admitting: Allergy & Immunology

## 2017-10-21 ENCOUNTER — Ambulatory Visit: Payer: BC Managed Care – PPO | Admitting: Allergy & Immunology

## 2017-10-21 ENCOUNTER — Encounter: Payer: Self-pay | Admitting: General Practice

## 2017-10-21 NOTE — Progress Notes (Signed)
See separate note from Dr. Darrick PennaFields for 10/20/2017.

## 2017-10-21 NOTE — Telephone Encounter (Signed)
Noted  

## 2017-11-09 ENCOUNTER — Ambulatory Visit: Payer: BC Managed Care – PPO | Admitting: Allergy & Immunology

## 2017-12-15 ENCOUNTER — Ambulatory Visit: Payer: BC Managed Care – PPO | Admitting: Gastroenterology

## 2018-01-24 ENCOUNTER — Ambulatory Visit: Payer: BC Managed Care – PPO | Admitting: Gastroenterology

## 2018-01-25 ENCOUNTER — Other Ambulatory Visit: Payer: Self-pay | Admitting: Family Medicine

## 2018-01-25 ENCOUNTER — Encounter: Payer: Self-pay | Admitting: Family Medicine

## 2018-01-25 MED ORDER — AMOXICILLIN 500 MG PO TABS: 500.0000 mg | ORAL_TABLET | Freq: Three times a day (TID) | ORAL | 1 refills | Status: DC

## 2018-03-17 ENCOUNTER — Ambulatory Visit: Payer: BC Managed Care – PPO | Admitting: Family Medicine

## 2018-03-17 ENCOUNTER — Encounter: Payer: Self-pay | Admitting: Family Medicine

## 2018-03-17 VITALS — BP 124/88 | Temp 98.1°F | Ht 66.5 in | Wt 234.0 lb

## 2018-03-17 DIAGNOSIS — J019 Acute sinusitis, unspecified: Secondary | ICD-10-CM

## 2018-03-17 MED ORDER — AMOXICILLIN-POT CLAVULANATE 875-125 MG PO TABS: 1.0000 | ORAL_TABLET | Freq: Two times a day (BID) | ORAL | 0 refills | Status: AC

## 2018-03-17 NOTE — Progress Notes (Signed)
   Subjective:    Patient ID: Amy Russell, female    DOB: Aug 29, 1980, 38 y.o.   MRN: [id number]  Sinusitis  This is a new problem. Episode onset: 2 days ago. Associated symptoms include chills, congestion, coughing, headaches, sinus pressure and a sore throat. Pertinent negatives include no shortness of breath. Treatments tried: tylenol sinus, delsym for cough and nigth time cough med.   Sunday not feeling well. Then Monday evening started with h/a, congestion, cough. Cough is nonproductive. Nose productive of green mucous. Reports chills. Mild body aches. Unsure of fever. Feels like she's getting worse, especially the last 2 days.   Review of Systems  Constitutional: Positive for chills and fatigue.  HENT: Positive for congestion, sinus pressure and sore throat.   Respiratory: Positive for cough. Negative for shortness of breath and wheezing.   Neurological: Positive for headaches.       Objective:   Physical Exam Vitals signs and nursing note reviewed.  Constitutional:      General: She is not in acute distress.    Appearance: She is not toxic-appearing.  HENT:     Head: Normocephalic and atraumatic.     Right Ear: Tympanic membrane is not erythematous.     Left Ear: Tympanic membrane is not erythematous.     Nose: Congestion present.     Right Sinus: Maxillary sinus tenderness present. No frontal sinus tenderness.     Left Sinus: Maxillary sinus tenderness present. No frontal sinus tenderness.     Mouth/Throat:     Mouth: Mucous membranes are moist.     Pharynx: Oropharynx is clear.  Eyes:     General:        Right eye: No discharge.        Left eye: No discharge.  Neck:     Musculoskeletal: Neck supple. No neck rigidity.  Cardiovascular:     Rate and Rhythm: Normal rate and regular rhythm.     Heart sounds: Normal heart sounds.  Pulmonary:     Effort: Pulmonary effort is normal. No respiratory distress.     Breath sounds: Normal breath sounds.  Lymphadenopathy:     Cervical: No cervical adenopathy.  Skin:    General: Skin is warm and dry.  Neurological:     Mental Status: She is alert and oriented to person, place, and time.  Psychiatric:        Mood and Affect: Mood normal.           Assessment & Plan:  Acute rhinosinusitis  Discussed with patient likely flu diagnosis, past the timeframe for tamiflu. Pt with sinus tenderness on exam, discussed likely still r/t flu but given written abx prescription in case worsens over the weekend. Symptomatic care discussed. Warning signs discussed. F/u if symptoms worsen or fail to improve.

## 2018-03-17 NOTE — Patient Instructions (Signed)

## 2018-04-14 ENCOUNTER — Encounter: Payer: Self-pay | Admitting: Family Medicine

## 2018-04-14 ENCOUNTER — Ambulatory Visit: Payer: BC Managed Care – PPO | Admitting: Family Medicine

## 2018-04-14 VITALS — BP 128/80 | Ht 66.5 in | Wt 232.4 lb

## 2018-04-14 DIAGNOSIS — J111 Influenza due to unidentified influenza virus with other respiratory manifestations: Secondary | ICD-10-CM | POA: Diagnosis not present

## 2018-04-14 DIAGNOSIS — J4541 Moderate persistent asthma with (acute) exacerbation: Secondary | ICD-10-CM | POA: Diagnosis not present

## 2018-04-14 MED ORDER — HYDROCODONE-HOMATROPINE 5-1.5 MG/5ML PO SYRP: 5.0000 mL | ORAL_SOLUTION | Freq: Four times a day (QID) | ORAL | 0 refills | Status: DC | PRN

## 2018-04-14 MED ORDER — OSELTAMIVIR PHOSPHATE 75 MG PO CAPS: 75.0000 mg | ORAL_CAPSULE | Freq: Two times a day (BID) | ORAL | 0 refills | Status: DC

## 2018-04-14 NOTE — Progress Notes (Signed)
   Subjective:    Patient ID: Amy Russell, female    DOB: Jan 26, 1981, 38 y.o.   MRN: [id number]  Cough  This is a new problem. The current episode started in the past 7 days. Associated symptoms include headaches, nasal congestion and a sore throat. Treatments tried: nyquil, delsym, tylenol.   mon night after school throat started huritng really bad   Got to feeling rough, felt horrible     Had trouble feeling sick   And now achey and headache     Severe cough  Inability to sleep   Hurting and aching     Review of Systems  HENT: Positive for sore throat.   Respiratory: Positive for cough.   Neurological: Positive for headaches.       Objective:   Physical Exam  Alert vitals reviewed, moderate malaise. Hydration good. Positive nasal congestion lungs no crackles or wheezes, no tachypnea, intermittent bronchial cough during exam heart regular rate and rhythm.       Assessment & Plan:  Impression influenza discussed at length. Ashby Dawes of illness and potential sequela discussed. Plan Tamiflu prescribed if indicated and timing appropriate. Symptom care discussed. Warning signs discussed. WSL Patient also encouraged to use albuterol regularly for wheezing component.  Also Hycodan as needed for back off

## 2018-04-18 ENCOUNTER — Ambulatory Visit: Payer: BC Managed Care – PPO | Admitting: Gastroenterology

## 2018-04-18 ENCOUNTER — Encounter: Payer: Self-pay | Admitting: Family Medicine

## 2018-05-16 ENCOUNTER — Encounter: Payer: Self-pay | Admitting: Family Medicine

## 2018-05-16 ENCOUNTER — Ambulatory Visit: Payer: BC Managed Care – PPO | Admitting: Family Medicine

## 2018-05-16 VITALS — BP 112/88 | Temp 98.2°F | Ht 66.5 in | Wt 234.0 lb

## 2018-05-16 DIAGNOSIS — J019 Acute sinusitis, unspecified: Secondary | ICD-10-CM | POA: Diagnosis not present

## 2018-05-16 MED ORDER — HYDROCODONE-HOMATROPINE 5-1.5 MG/5ML PO SYRP: 5.0000 mL | ORAL_SOLUTION | Freq: Four times a day (QID) | ORAL | 0 refills | Status: DC | PRN

## 2018-05-16 MED ORDER — DOXYCYCLINE HYCLATE 100 MG PO TABS: 100.0000 mg | ORAL_TABLET | Freq: Two times a day (BID) | ORAL | 0 refills | Status: DC

## 2018-05-16 MED ORDER — ALBUTEROL SULFATE HFA 108 (90 BASE) MCG/ACT IN AERS: 2.0000 | INHALATION_SPRAY | Freq: Four times a day (QID) | RESPIRATORY_TRACT | 2 refills | Status: DC | PRN

## 2018-05-16 NOTE — Progress Notes (Signed)
   Subjective:    Patient ID: Amy Russell, female    DOB: [birth year], 38 y.o.   MRN: [id number]  HPI  Patient is here today with complaints of sinus pressure, sinus h/a,cough,stuffy nose,sore throat.   Symptoms began on Friday.  She has been taking Delsym,Tylenol sinus.   6 days ago started with sore throat, 4 days ago started with cough and sinus pressure. Cough non productive. Unsure of fever. No chills or bodyaches. Nose productive of green mucous. Needing albuterol inhaler a couple times typically at night.    Review of Systems  Constitutional: Negative for chills and fever.  HENT: Positive for congestion, sinus pressure, sinus pain and sore throat. Negative for ear pain.   Eyes: Negative for discharge.  Respiratory: Positive for cough. Negative for shortness of breath.   Musculoskeletal: Negative for myalgias.       Objective:   Physical Exam Vitals signs and nursing note reviewed.  Constitutional:      General: She is not in acute distress.    Appearance: Normal appearance. She is not toxic-appearing.  HENT:     Head: Normocephalic and atraumatic.     Right Ear: Tympanic membrane normal.     Left Ear: Tympanic membrane normal.     Nose: Congestion present.     Right Sinus: Maxillary sinus tenderness and frontal sinus tenderness present.     Left Sinus: Maxillary sinus tenderness and frontal sinus tenderness present.     Comments: Significant sinus tenderness, worse to bilateral maxillary    Mouth/Throat:     Mouth: Mucous membranes are moist.     Pharynx: Posterior oropharyngeal erythema present. No oropharyngeal exudate.  Neck:     Musculoskeletal: Neck supple. No neck rigidity.  Cardiovascular:     Rate and Rhythm: Normal rate and regular rhythm.     Heart sounds: Normal heart sounds.  Pulmonary:     Effort: Pulmonary effort is normal. No respiratory distress.     Breath sounds: Normal breath sounds. No wheezing or rales.  Lymphadenopathy:     Cervical:  No cervical adenopathy.  Skin:    General: Skin is warm and dry.  Neurological:     Mental Status: She is alert and oriented to person, place, and time.  Psychiatric:        Behavior: Behavior normal.           Assessment & Plan:  Acute rhinosinusitis  Discussed likely post-viral sinusitis. Given severity of symptoms will go ahead and cover for bacterial etiology with doxycycline x 10 days. Symptomatic care discussed. Pt with cough worse at night - states otc meds not helpful and unable to sleep. Will rx hycodan cough syrup, PDMP database checked, pt knows to only take 1 tsp at nighttime prn cough. She verbalized understanding. Use albuterol prn for wheezing, no wheezing on exam today.  If symptoms worsen or fail to improve she should f/u. To ED if any difficulty breathing or severe symptoms.

## 2018-05-18 ENCOUNTER — Telehealth: Payer: Self-pay | Admitting: Family Medicine

## 2018-05-18 NOTE — Telephone Encounter (Signed)
Pt seen 05/16/2018, worked yesterday but not today  Can we give work excuse for 05/18/2018 to return to work 05/19/2018?  Please advise & call pt

## 2018-05-18 NOTE — Telephone Encounter (Signed)
Please advise. Thank you

## 2018-05-18 NOTE — Telephone Encounter (Signed)
Please give work excuse. Thank you

## 2018-05-18 NOTE — Telephone Encounter (Signed)
Please go ahead and give work excuse

## 2018-05-19 ENCOUNTER — Encounter: Payer: Self-pay | Admitting: Family Medicine

## 2018-05-19 NOTE — Telephone Encounter (Signed)
Work note ready for pick up  LMOVM that work note is ready for pick up

## 2018-05-29 ENCOUNTER — Other Ambulatory Visit: Payer: Self-pay | Admitting: Family Medicine

## 2018-05-30 NOTE — Telephone Encounter (Signed)
This +3 refills 

## 2018-06-21 ENCOUNTER — Other Ambulatory Visit: Payer: Self-pay

## 2018-06-21 ENCOUNTER — Encounter: Payer: Self-pay | Admitting: Family Medicine

## 2018-06-21 ENCOUNTER — Ambulatory Visit (INDEPENDENT_AMBULATORY_CARE_PROVIDER_SITE_OTHER): Payer: BC Managed Care – PPO | Admitting: Family Medicine

## 2018-06-21 DIAGNOSIS — J4520 Mild intermittent asthma, uncomplicated: Secondary | ICD-10-CM

## 2018-06-21 MED ORDER — AZELASTINE HCL 0.1 % NA SOLN: 2.0000 | Freq: Two times a day (BID) | NASAL | 12 refills | Status: DC

## 2018-06-21 MED ORDER — HYDROCODONE-HOMATROPINE 5-1.5 MG/5ML PO SYRP: 5.0000 mL | ORAL_SOLUTION | Freq: Four times a day (QID) | ORAL | 0 refills | Status: DC | PRN

## 2018-06-21 MED ORDER — MONTELUKAST SODIUM 10 MG PO TABS: 10.0000 mg | ORAL_TABLET | Freq: Every day | ORAL | 3 refills | Status: DC

## 2018-06-21 NOTE — Progress Notes (Signed)
   Subjective:    Patient ID: Amy Russell, female    DOB: 09-Oct-1980, 38 y.o.   MRN: [id number] Video and audio Patient at home I was at the office Cough  This is a recurrent problem. Episode onset: pt seen in March. The problem has been gradually worsening. The cough is non-productive. Associated symptoms comments: Pt states she doesn't feel great, but she had not felt great since she seen [place] in March. Has a sinus headache. Treatments tried: Mucinex DM , Delsyum, Robitussion, Allergy med, night time cold and flu. The treatment provided no relief.   Virtual Visit via Video Note  I connected with Amy Russell on 06/21/18 at  3:50 PM EDT by a video enabled telemedicine application and verified that I am speaking with the correct person using two identifiers.   I discussed the limitations of evaluation and management by telemedicine and the availability of in person appointments. The patient expressed understanding and agreed to proceed.  History of Present Illness:    Observations/Objective:   Assessment and Plan:   Follow Up Instructions:    I discussed the assessment and treatment plan with the patient. The patient was provided an opportunity to ask questions and all were answered. The patient agreed with the plan and demonstrated an understanding of the instructions.   The patient was advised to call back or seek an in-person evaluation if the symptoms worsen or if the condition fails to improve as anticipated.  I provided 15 minutes of non-face-to-face time during this encounter.   Marlowe Shores, LPN    Review of Systems  Respiratory: Positive for cough.        Objective:   Physical Exam  Unable to do exam via video patient did not seem to be in severe distress 1 week interacted with her      Assessment & Plan:  Significant seasonal allergies she will use allergy tablets along with nasal spray.  In addition to this if she starts developing fever or signs  of sinus infection she will let us know I instructed her to try Astelin spray for her nose in place of Flonase  As for the reactive airway she will use the albuterol on a regular basis and add Singulair if that does not do enough we will consider short course of steroids and steroid inhaler but currently right now holding off on this because of coronavirus

## 2018-06-28 ENCOUNTER — Encounter: Payer: Self-pay | Admitting: Family Medicine

## 2018-06-29 ENCOUNTER — Ambulatory Visit (INDEPENDENT_AMBULATORY_CARE_PROVIDER_SITE_OTHER): Payer: BC Managed Care – PPO | Admitting: Gastroenterology

## 2018-06-29 ENCOUNTER — Encounter: Payer: Self-pay | Admitting: Gastroenterology

## 2018-06-29 ENCOUNTER — Other Ambulatory Visit: Payer: Self-pay

## 2018-06-29 DIAGNOSIS — R197 Diarrhea, unspecified: Secondary | ICD-10-CM

## 2018-06-29 MED ORDER — DICYCLOMINE HCL 10 MG PO CAPS: ORAL_CAPSULE | ORAL | 3 refills | Status: DC

## 2018-06-29 NOTE — Patient Instructions (Addendum)
1. Continue bentyl up to four times daily for diarrhea/abdominal cramping.  2. I would encourage you to follow up with your allergist regarding your hives and to follow up on elevated inflammatory markers (sed rate and CRP). If you are unable to get those repeated, let me know and we will recheck in a couple of months.  3. If you continue to have joint pain and your inflammatory markers remain elevated, you may benefit from seeing a rheumatologist.  4. Return to our office in 3 months for a follow up.

## 2018-06-29 NOTE — Progress Notes (Signed)
Primary Care Physician:  [name], MD Primary GI:  Jonette Eva, MD   Patient Location: Home  Provider Location: [facility] office  Reason for Visit: follow up diarrhea, abdominal pain  Persons present on the virtual encounter, with roles: Patient, myself (provider),Mindy Edward Jolly CMA (updated meds and allergies)  Total time (minutes) spent on medical discussion: 20 minutes  Due to COVID-19, visit was conducted using Facetime method.  Visit was requested by patient.  Virtual Visit via Facetime  I connected with [name] on 06/29/18 at  3:00 PM EDT by Facetime and verified that I am speaking with the correct person using two identifiers.   I discussed the limitations, risks, security and privacy concerns of performing an evaluation and management service by telephone/video and the availability of in person appointments. I also discussed with the patient that there may be a patient responsible charge related to this service. The patient expressed understanding and agreed to proceed.   HPI:   Amy Russell is a 38 y.o. female who presents for virtual visit regarding abdominal pain/diarrhea.  Patient initially seen back in July 2019.  Diagnosed with IBS in the remote past.  Developed postprandial urgency/diarrhea, sometimes nocturnal diarrhea which persisted from approximately May to November of last year.  Noted increased stress with her job as well brother's illness, ultimately required heart transplant.  Times having up to 10 stools per day.  Intermittent rectal bleeding.  Associated abdominal cramping.  She had negative TTG IgA, normal sed rate, negative stool studies.  Completed colonoscopy in August 2019 with normal-appearing terminal ileum and colon except for hemorrhoids.  Random colon biopsy showed underlying proctocolitis, felt to be likely due to NSAID use, less likely due to early IBD.  She subsequently stopped ibuprofen.  Shortly after colonoscopy she saw allergist  for history of hives.  Sed rate noted to be 63, CRP 25, both abnormal.  ANA negative.  Alpha gal panel negative.  H. pylori serologies were negative.  Symptoms improved after November.  Overall he did much better but she does not classify herself as quite normal.  Over the weekend she developed acute onset abdominal cramping associated with significant diarrhea, body aches, fatigue which lasted for a couple of days.  She started to feel better today.  She has had 4 stools today.  She did have some nocturnal diarrhea last night.  Low-volume bright red blood per rectum.  Symptoms seem to start after eating barbecue which she has not had in a long time.  Certain food she knows she cannot tolerate.  Avoids cheese.  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  Continues to have hives.  Supposed to follow-up with her allergist but she failed to do that.  She reports plans to repeat sed rate and CRP.    She was on antibiotics back in March, doxycycline, for sinusitis.    Heartburn well controlled on pantoprazole.No nsaids/asa.    Current Outpatient Medications  Medication Sig Dispense Refill   acetaminophen (TYLENOL) 500 MG tablet Take 1,000 mg by mouth every 6 (six) hours as needed (for pain.).     albuterol (PROVENTIL HFA;VENTOLIN HFA) 108 (90 Base) MCG/ACT inhaler Inhale 2 puffs into the lungs every 6 (six) hours as needed for wheezing. 1 Inhaler 2   azelastine (ASTELIN) 0.1 % nasal spray Place 2 sprays into both nostrils 2 (two) times  daily. 30 mL 12   cetirizine (ZYRTEC) 10 MG tablet Take 10 mg by mouth at bedtime.      dicyclomine (BENTYL) 10 MG capsule Take 10 mg by mouth as needed.      diphenhydrAMINE (BENADRYL) 25 mg capsule Take 25 mg by mouth every 6 (six) hours as needed (hives).      guaiFENesin (MUCINEX) 600 MG 12 hr tablet Take by mouth 2 (two) times daily as needed.      HYDROcodone-homatropine (HYCODAN) 5-1.5 MG/5ML syrup Take 5 mLs by mouth every 6 (six) hours as needed for cough. 90 mL 0   hydrocortisone 2.5 % cream One bid to rash (Patient taking differently: One bid to rash as needed) 60 g 2   montelukast (SINGULAIR) 10 MG tablet Take 1 tablet (10 mg total) by mouth at bedtime. 30 tablet 3   pantoprazole (PROTONIX) 40 MG tablet Take 1 tablet (40 mg total) by mouth daily. Take 30 minutes before breakfast 90 tablet 3   No current facility-administered medications for this visit.     Past Medical History:  Diagnosis Date   Allergy    Asthma    Chronic urticaria 09/07/2017   GERD (gastroesophageal reflux disease)    Recurrent upper respiratory infection (URI)     Past Surgical History:  Procedure Laterality Date   CHOLECYSTECTOMY     in college   COLONOSCOPY N/A 10/12/2017   Dr. Darrick Penna: Normal terminal ileum, tortuous colon, hemorrhoids.  Biopsy showed proctocolitis possibly NSAID related.   MANDIBLE SURGERY     as a teenager    Family History  Problem Relation Age of Onset   Colon polyps Father        possibly, unknown age    Pancreatitis Mother        ?pancreas divisum   Diverticulitis Sister    Colon cancer Neg Hx    Crohn's disease Neg Hx    Ulcerative colitis Neg Hx     Social History   Socioeconomic History   Marital status: Single    Spouse name: Not on file   Number of children: Not on file   Years of education: Not on file   Highest education level: Not on file  Occupational History   Occupation: [employer]    Comment: Airline pilot strain: Not on file   Food insecurity:    Worry: Not on file    Inability: Not on file   Transportation needs:    Medical: Not on file    Non-medical: Not on file  Tobacco Use   Smoking status: Never Smoker   Smokeless tobacco: Never Used  Substance and Sexual Activity   Alcohol use: Never    Frequency: Never   Drug use:  Never   Sexual activity: Not on file  Lifestyle   Physical activity:    Days per week: Not on file    Minutes per session: Not on file   Stress: Not on file  Relationships   Social connections:    Talks on phone: Not on file    Gets together: Not on file    Attends religious service: Not on file    Active member of club or organization: Not on file    Attends meetings of clubs or organizations: Not on file    Relationship status: Not on file   Intimate partner violence:    Fear of current or ex partner: Not on file    Emotionally  abused: Not on file    Physically abused: Not on file    Forced sexual activity: Not on file  Other Topics Concern   Not on file  Social History Narrative   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 2 KIDS: AGE 64, 5. DIVORCED.      ROS:  General: Negative for anorexia, weight loss, fever, chills, fatigue, weakness. Eyes: Negative for vision changes.  ENT: Negative for hoarseness, difficulty swallowing, nasal congestion. CV: Negative for chest pain, angina, palpitations, dyspnea on exertion, peripheral edema.  Respiratory: Negative for dyspnea at rest, dyspnea on exertion, cough, sputum, wheezing.  GI: See history of present illness. GU:  Negative for dysuria, hematuria, urinary incontinence, urinary frequency, nocturnal urination.  MS: Intermittent joint pain, no low back pain.  Derm: Negative for rash or itching.  Neuro: Negative for weakness, abnormal sensation, seizure, frequent headaches, memory loss, confusion.  Psych: Negative for anxiety, depression, suicidal ideation, hallucinations.  Endo: Negative for unusual weight change.  Heme: Negative for bruising or bleeding. Allergy: Negative for rash or hives.   Observations/Objective: Pleasant well-nourished well-developed Caucasian female in no acute distress.  Otherwise exam unavailable.  Assessment and Plan: 38 year old female with recurrent abdominal pain associate with diarrhea.  She is felt to have  IBS likely with a flare at this moment related to certain food and/or stress.  Biopsies from the colon and rectum at time of colonoscopy in August with some active colitis without notable chronicity felt to be mostly NSAID related, less likely early IBD. Interestingly she had elevated sed rate/crp in 10/2017. Plans to follow up with allergist for follow up. Would be interesting to have those rechecked given intermittent joint pains as well. May ultimately benefit from rheumatology evaluation. For now, continue bentyl. New RX provided. Call if no improvement in symptoms.    Follow Up Instructions:    I discussed the assessment and treatment plan with the patient. The patient was provided an opportunity to ask questions and all were answered. The patient agreed with the plan and demonstrated an understanding of the instructions. AVS mailed to patient's home address.   The patient was advised to call back or seek an in-person evaluation if the symptoms worsen or if the condition fails to improve as anticipated.  I provided 20 minutes of virtual face-to-face time during this encounter.   Tana CoastLeslie Thomas Rhude, PA-C

## 2018-06-30 ENCOUNTER — Encounter: Payer: Self-pay | Admitting: Internal Medicine

## 2018-06-30 NOTE — Progress Notes (Signed)
cc'ed to pcp °

## 2018-07-23 ENCOUNTER — Other Ambulatory Visit: Payer: Self-pay | Admitting: Gastroenterology

## 2018-08-22 ENCOUNTER — Other Ambulatory Visit: Payer: Self-pay | Admitting: *Deleted

## 2018-08-22 MED ORDER — ALBUTEROL SULFATE HFA 108 (90 BASE) MCG/ACT IN AERS: 2.0000 | INHALATION_SPRAY | Freq: Four times a day (QID) | RESPIRATORY_TRACT | 2 refills | Status: DC | PRN

## 2018-09-13 ENCOUNTER — Ambulatory Visit: Payer: BC Managed Care – PPO | Admitting: Allergy & Immunology

## 2018-09-13 ENCOUNTER — Encounter: Payer: Self-pay | Admitting: Allergy & Immunology

## 2018-09-13 ENCOUNTER — Other Ambulatory Visit: Payer: Self-pay

## 2018-09-13 VITALS — BP 112/88 | HR 98 | Temp 97.5°F | Resp 16 | Ht 66.54 in | Wt 247.0 lb

## 2018-09-13 DIAGNOSIS — M19049 Primary osteoarthritis, unspecified hand: Secondary | ICD-10-CM

## 2018-09-13 DIAGNOSIS — J302 Other seasonal allergic rhinitis: Secondary | ICD-10-CM

## 2018-09-13 DIAGNOSIS — R1030 Lower abdominal pain, unspecified: Secondary | ICD-10-CM | POA: Diagnosis not present

## 2018-09-13 DIAGNOSIS — J3089 Other allergic rhinitis: Secondary | ICD-10-CM | POA: Diagnosis not present

## 2018-09-13 DIAGNOSIS — L508 Other urticaria: Secondary | ICD-10-CM | POA: Diagnosis not present

## 2018-09-13 MED ORDER — FAMOTIDINE 40 MG PO TABS: 40.0000 mg | ORAL_TABLET | Freq: Every day | ORAL | 5 refills | Status: DC

## 2018-09-13 MED ORDER — MONTELUKAST SODIUM 10 MG PO TABS: 10.0000 mg | ORAL_TABLET | Freq: Every day | ORAL | 5 refills | Status: DC

## 2018-09-13 NOTE — Progress Notes (Signed)
FOLLOW UP  Date of Service/Encounter:  09/13/18   Assessment:   Chronic urticaria - in the setting of chronic abdominal pain and negative testing to the most common food allergens  Seasonal and perennial allergic rhinitis (trees, weeds, indoor molds, dust mites and cockroach)  Plan/Recommendations:   1. Chronic urticaria - We are going to get a repeat CRP and ESR.  - We are going to get an ANA as well as a RF (these can be positive in autoimmune diseases). - We are going to refer you to see Rheumatology.  - In the meantime, continue suppressive dosing of antihistamines:   - Morning: Allegra (fexofenadine) 180-373m (one to two tablets)   - Evening: Zyrtec (cetirizine) 10-284m(one to two tablets) + Singulair (montelukast) 1026m Pepcid 64m20mily  - You can change this dosing at home, decreasing the dose as needed or increasing the dosing as needed.  - If you are not tolerating the medications or are tired of taking them every day, we can start treatment with a monthly injectable medication called Xolair.   2. Seasonal and perennial allergic rhinitis (trees, weeds, indoor molds, dust mites and cockroach) - Continue with: Zyrtec (cetirizine) 10mg76mlet once daily, Singulair (montelukast) 10mg 52my and Flonase (fluticasone) one spray per nostril daily - You can use an extra dose of the antihistamine, if needed, for breakthrough symptoms.  - Consider nasal saline rinses 1-2 times daily to remove allergens from the nasal cavities as well as help with mucous clearance (this is especially helpful to do before the nasal sprays are given) - Consider allergy shots as a means of long-term control.  3. Return in about 3 months (around 12/14/2018). This can be an in-person, a virtual Webex or a telephone follow up visit.  Subjective:   Amy MANNI38 y.o62female presenting today for follow up of  Chief Complaint  Patient presents with  . Urticaria    still having issues, has  hives on her face today, having some joint pain as well    Amy Russell history of the following: Patient Active Problem List   Diagnosis Date Noted  . Diarrhea   . Abdominal pain 09/24/2017  . GERD (gastroesophageal reflux disease) 09/24/2017  . Rectal bleeding 09/24/2017  . Chronic urticaria 09/07/2017  . Reactive airway disease 11/25/2015    History obtained from: chart review and patient.  [name]38 y.o89female presenting for a follow up visit.  She was last seen in August 2019 as a new patient.  At that time, she was complaining of chronic urticaria.  We did testing to the most common foods and this was negative.  We did obtain quite a bit of lab work.  We started her on Allegra 1 to 2 tablets in the morning and Zyrtec 1 to 2 tablets in the evening in combination with Singulair 10 mg daily.  She had testing that was positive to trees, weeds, indoor molds, dust mite, and cockroach.  We started her on Flonase 1 spray per nostril daily.  We did do an extensive lab work-up at the last visit.  It was notable for an ESR of 63 and a CRP of 25.  Otherwise it was completely normal.  An ANA, thyroid antibodies, chronic urticaria panel, alpha gal panel, and H. pylori antibodies were negative. A CBC showed a milk left shift but was otherwise normal.   On the last visit, her brother received a heart transplant.  He is doing well and has had no signs of rejection thus far. He continues to do fairly well.   Since the last visit, she has not done well. She remains on all of her antihistamines including Allegra two tablets in the morning and cetirizine two tablets at night. She remains on montelukast nightly as well.  She continues to have urticaria despite all of these medications.  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  She has been very compliant with his medications.  She has not tried taking an  additional antihistamines.  She is not on an H2 blocker, but she is on Protonix.  She continues to have GI problems that she was talking about the last visit.  She follows with Dr. Barney Drain. There is no particular diagnosis for her symptoms.  She is also reporting swelling and pain within her metacarpals. She has not seen rheumatology yet, but is interested in a referral.  Otherwise, there have been no changes to her past medical history, surgical history, family history, or social history.    Review of Systems  Constitutional: Negative.  Negative for chills, fever, malaise/fatigue and weight loss.  HENT: Negative.  Negative for congestion, ear discharge and ear pain.   Eyes: Negative for pain, discharge and redness.  Respiratory: Negative for cough, sputum production, shortness of breath and wheezing.   Cardiovascular: Negative.  Negative for chest pain and palpitations.  Gastrointestinal: Negative for abdominal pain, constipation, diarrhea, heartburn, nausea and vomiting.  Skin: Positive for itching and rash.  Neurological: Negative for dizziness and headaches.  Endo/Heme/Allergies: Negative for environmental allergies. Does not bruise/bleed easily.       Objective:   Blood pressure 112/88, pulse 98, temperature (!) 97.5 F (36.4 C), temperature source Temporal, resp. rate 16, height 5' 6.54" (1.69 m), weight 247 lb (112 kg), SpO2 98 %. Body mass index is 39.23 kg/m.   Physical Exam:  Physical Exam  Constitutional: She appears well-developed.  HENT:  Head: Normocephalic and atraumatic.  Right Ear: Tympanic membrane, external ear and ear canal normal.  Left Ear: Tympanic membrane and ear canal normal.  Nose: No mucosal edema, rhinorrhea, nasal deformity or septal deviation. No epistaxis. Right sinus exhibits no maxillary sinus tenderness and no frontal sinus tenderness. Left sinus exhibits no maxillary sinus tenderness and no frontal sinus tenderness.  Mouth/Throat: Uvula  is midline and oropharynx is clear and moist. Mucous membranes are not pale and not dry.  Eyes: Pupils are equal, round, and reactive to light. Conjunctivae and EOM are normal. Right eye exhibits no chemosis and no discharge. Left eye exhibits no chemosis and no discharge. Right conjunctiva is not injected. Left conjunctiva is not injected.  Cardiovascular: Normal rate, regular rhythm and normal heart sounds.  Respiratory: Effort normal and breath sounds normal. No accessory muscle usage. No tachypnea. No respiratory distress. She has no wheezes. She has no rhonchi. She has no rales. She exhibits no tenderness.  Lymphadenopathy:    She has no cervical adenopathy.  Neurological: She is alert.  Skin: No abrasion, no petechiae and no rash noted. Rash is not papular, not vesicular and not urticarial. No erythema. No pallor.  She does have some flat erythematous macules on her bilateral upper arms as well as her cheeks.  Psychiatric: She has a normal mood and affect.     Diagnostic studies: labs sent instead      Fara Olden  Ernst Bowler, MD  [facility]

## 2018-09-13 NOTE — Patient Instructions (Addendum)
1. Chronic urticaria - We are going to get a repeat CRP and ESR.  - We are going to get an ANA as well as a RF (these can be positive in autoimmune diseases). - We are going to refer you to see Rheumatology.  - In the meantime, continue suppressive dosing of antihistamines:   - Morning: Allegra (fexofenadine) 180-380m (one to two tablets)   - Evening: Zyrtec (cetirizine) 10-245m(one to two tablets) + Singulair (montelukast) 1078m Pepcid 23m28mily  - You can change this dosing at home, decreasing the dose as needed or increasing the dosing as needed.  - If you are not tolerating the medications or are tired of taking them every day, we can start treatment with a monthly injectable medication called Xolair.   2. Seasonal and perennial allergic rhinitis (trees, weeds, indoor molds, dust mites and cockroach) - Continue with: Zyrtec (cetirizine) 10mg67mlet once daily, Singulair (montelukast) 10mg 70my and Flonase (fluticasone) one spray per nostril daily - You can use an extra dose of the antihistamine, if needed, for breakthrough symptoms.  - Consider nasal saline rinses 1-2 times daily to remove allergens from the nasal cavities as well as help with mucous clearance (this is especially helpful to do before the nasal sprays are given) - Consider allergy shots as a means of long-term control.  3. Return in about 3 months (around 12/14/2018). This can be an in-person, a virtual Webex or a telephone follow up visit.   Please inform us of [facility] visits, hospitalizations, or changes in symptoms. Call us befKoreae going to the ED for breathing or allergy symptoms since we might be able to fit you in for a sick visit. Feel free to contact us anyKoreame with any questions, problems, or concerns.  It was a pleasure to see you again today! I am glad to hear that your brother is doing so well!   Websites that have reliable patient information: 1. American Academy of Asthma, Allergy, and  Immunology: www.aaaai.org 2. Food Allergy Research and Education (FARE): foodallergy.org 3. Mothers of Asthmatics: http://www.asthmacommunitynetwork.org 4. American College of Allergy, Asthma, and Immunology: www.acaai.org  "Like" us on Koreacebook and Instagram for our latest updates!      Make sure you are registered to vote! If you have moved or changed any of your contact information, you will need to get this updated before voting!  In some cases, you MAY be able to register to vote online: [email or web address] ID laws are NOT going into effect for the General Election in November 2020! DO NOT let this stop you from exercising your right to vote!   Absentee voting is the SAFEST way to vote during the coronavirus pandemic!   Download and print an absentee ballot request form at [email or web address] or you can scan the QR code below with your smart phone:      More information on absentee ballots can be found here: [email or web address]

## 2018-09-14 LAB — SEDIMENTATION RATE: Sed Rate: 38 mm/hr — ABNORMAL HIGH (ref 0–32)

## 2018-09-14 LAB — RHEUMATOID FACTOR: Rheumatoid fact SerPl-aCnc: <10 IU/mL (ref 0.0–13.9)

## 2018-09-14 LAB — ANA W/REFLEX IF POSITIVE: Anti Nuclear Antibody (ANA): NEGATIVE

## 2018-09-14 LAB — C-REACTIVE PROTEIN: CRP: 17 mg/L — ABNORMAL HIGH (ref 0–10)

## 2018-09-16 ENCOUNTER — Telehealth: Payer: Self-pay

## 2018-09-16 NOTE — Telephone Encounter (Signed)
Referral has been placed to Dr Herminio Heads the rheumatologist.

## 2018-09-16 NOTE — Telephone Encounter (Signed)
-----   Message from Valentina Shaggy, MD sent at 09/15/2018  8:22 AM EDT ----- MyChart message sent. Dee - did I put in a Rheumatology referral for this patient? I think so, but check on it please!   Thanks, Salvatore Marvel, MD [facility]

## 2018-09-20 ENCOUNTER — Encounter: Payer: Self-pay | Admitting: Allergy & Immunology

## 2018-09-20 ENCOUNTER — Encounter: Payer: Self-pay | Admitting: Family Medicine

## 2018-09-23 ENCOUNTER — Ambulatory Visit (INDEPENDENT_AMBULATORY_CARE_PROVIDER_SITE_OTHER): Payer: BC Managed Care – PPO | Admitting: Nurse Practitioner

## 2018-09-23 ENCOUNTER — Other Ambulatory Visit: Payer: Self-pay

## 2018-09-23 DIAGNOSIS — J029 Acute pharyngitis, unspecified: Secondary | ICD-10-CM

## 2018-09-23 DIAGNOSIS — H9202 Otalgia, left ear: Secondary | ICD-10-CM | POA: Diagnosis not present

## 2018-09-23 MED ORDER — AZITHROMYCIN 250 MG PO TABS: ORAL_TABLET | ORAL | 0 refills | Status: DC

## 2018-09-23 NOTE — Progress Notes (Signed)
   Subjective:  VIRTUAL VISIT  Patient ID: Amy Russell, female    DOB: [birth year], 38 y.o.   MRN: [id number]  Sore Throat  This is a new problem. Episode onset: 2 days. Neither side of throat is experiencing more pain than the other. Associated symptoms comments: Left ear pain. Treatments tried: mucinex, tylenol.   Virtual Visit via Video Note  I connected with [name] on 09/23/18 at 10:10 AM EDT by a video enabled telemedicine application and verified that I am speaking with the correct person using two identifiers.  Location: Patient: home Provider: office   I discussed the limitations of evaluation and management by telemedicine and the availability of in person appointments. The patient expressed understanding and agreed to proceed.  History of Present Illness: Presents virtual visit for complaints of sore throat for the past 3 days.  Had someone look at her throat which she describes as "red".  No fever.  Left ear pain.  No drainage from the ear.  Minimal cough.  No new rash.  No known contacts.  Mild head congestion runny nose.  Taking fluids well.  Voiding normal limit.   Observations/Objective: Format  Patient present at home Provider present at office Consent for interaction obtained Coronavirus outbreak made virtual visit necessary  NAD.  Alert, oriented.  Assessment and Plan:   ICD-10-CM   1. Pharyngitis, unspecified etiology  J02.9   2. Left ear pain  H92.02    Meds ordered this encounter  Medications  . azithromycin (ZITHROMAX Z-PAK) 250 MG tablet    Sig: Take 2 tablets (500 mg) on  Day 1,  followed by 1 tablet (250 mg) once daily on Days 2 through 5.    Dispense:  6 each    Refill:  0    Given for pharyngitis (J02.9) and left ear pain (H92.02)    Order Specific Question:   Supervising Provider    Answer:   Sallee Lange A [[id number]]     Follow Up Instructions: Start Z-Pak as directed.  Patient denies any pregnancy.  Reviewed symptomatic care and  warning signs.  Call back in 72 hours if no improvement, sooner if worse.   I discussed the assessment and treatment plan with the patient. The patient was provided an opportunity to ask questions and all were answered. The patient agreed with the plan and demonstrated an understanding of the instructions.   The patient was advised to call back or seek an in-person evaluation if the symptoms worsen or if the condition fails to improve as anticipated.  I provided 15 minutes of non-face-to-face time during this encounter.     Review of Systems     Objective:   Physical Exam        Assessment & Plan:

## 2018-09-24 ENCOUNTER — Encounter: Payer: Self-pay | Admitting: Nurse Practitioner

## 2018-10-03 ENCOUNTER — Encounter: Payer: Self-pay | Admitting: Gastroenterology

## 2018-10-03 ENCOUNTER — Other Ambulatory Visit: Payer: Self-pay

## 2018-10-03 ENCOUNTER — Ambulatory Visit: Payer: BC Managed Care – PPO | Admitting: Gastroenterology

## 2018-10-03 VITALS — BP 135/86 | HR 84 | Temp 96.9°F | Ht 67.0 in | Wt 251.2 lb

## 2018-10-03 DIAGNOSIS — R197 Diarrhea, unspecified: Secondary | ICD-10-CM | POA: Diagnosis not present

## 2018-10-03 NOTE — Progress Notes (Signed)
Primary Care Physician: Babs SciaraLuking, Scott A, MD  Primary Gastroenterologist:  Jonette EvaSandi Fields, MD   Chief Complaint  Patient presents with  . Diarrhea    HPI: Amy Russell is a 38 y.o. female here for follow-up.  She has a history of abdominal pain/diarrhea, diagnosed with IBS in the remote past.  She was last seen in April.  Last year she had refractory symptoms with increased stress, upwards to 10 stools per day intermittent bleeding.  Celiac serologies were negative.  Normal sed rate 09/2017.  Negative stool studies.  Completed colonoscopy August 2019 with normal-appearing terminal ileum and colon except for hemorrhoids.  Random colon biopsy showed underlying proctocolitis felt to be NSAID use, less likely due to IBD.  She subsequently stopped ibuprofen.  She saw her allergist shortly after colonoscopy for history of hives.  Sed rate 10/2017 was elevated at 63, CRP 25.  ANA negative.  Alpha gal panel negative.  H. pylori serologies negative.  We did a virtual visit in April and overall she felt like she was improved but not quite back to normal.  Definitely cannot tolerate certain foods, avoids american cheese.  Abdominal cramping with diarrhea tends to be worse with stress. In the past with her IBS, she may have a couple of bad days in a row and then have normal stools. At this point, rarely has normal stool. BM 3-6 times per day, [place] 5-7. Occasional nocturnal stool. She has been having a lot of issues with hives/rash without clear diagnosis. Has joint pain intermittently involving the upper extremities (shoulders/elbows/hands) mostly but sometimes knees. DIP joints somewhat inflamed looking at times. Always has rash on her face, along the cheeks. When her joints her, she tends to have more abdominal pain/diarrhea. Occasional brbpr. No melena. Some increased heartburn lately but she thinks its do to stress. Bentyl helps slow down stools sometimes. She takes as needed, not daily.   Seen  by her allergist in July.  She had repeat CRP which is elevated at 17, sed rate elevated at 38, RF and ANA negative.  Referral made to rheumatology.  Still paying on last years colonoscopy. States she did not have good sedation.    Current Outpatient Medications  Medication Sig Dispense Refill  . acetaminophen (TYLENOL) 500 MG tablet Take 1,000 mg by mouth every 6 (six) hours as needed (for pain.).    Marland Kitchen. albuterol (VENTOLIN HFA) 108 (90 Base) MCG/ACT inhaler Inhale 2 puffs into the lungs every 6 (six) hours as needed for wheezing. 1 Inhaler 2  . cetirizine (ZYRTEC) 10 MG tablet Take 10 mg by mouth at bedtime.     . dicyclomine (BENTYL) 10 MG capsule TAKE 1 CAPSULE BEFORE MEALS AND 1 AT BEDTIME. 120 capsule 3  . diphenhydrAMINE (BENADRYL) 25 mg capsule Take 25 mg by mouth every 6 (six) hours as needed (hives).     . famotidine (PEPCID) 40 MG tablet Take 1 tablet (40 mg total) by mouth daily. 30 tablet 5  . fexofenadine (ALLEGRA) 180 MG tablet Take 180 mg by mouth daily.    . fluticasone (FLONASE) 50 MCG/ACT nasal spray Place into both nostrils daily.    Marland Kitchen. guaiFENesin (MUCINEX) 600 MG 12 hr tablet Take by mouth 2 (two) times daily as needed.    . hydrocortisone 2.5 % cream One bid to rash (Patient taking differently: One bid to rash as needed) 60 g 2  . montelukast (SINGULAIR) 10 MG tablet Take 1 tablet (10 mg total)  by mouth at bedtime. 30 tablet 5   No current facility-administered medications for this visit.     Allergies as of 10/03/2018  . (No Known Allergies)    ROS:  General: Negative for anorexia, weight loss, fever, chills, fatigue, weakness. ENT: Negative for hoarseness, difficulty swallowing , nasal congestion. CV: Negative for chest pain, angina, palpitations, dyspnea on exertion, peripheral edema.  Respiratory: Negative for dyspnea at rest, dyspnea on exertion, cough, sputum, wheezing.  GI: See history of present illness. GU:  Negative for dysuria, hematuria, urinary  incontinence, urinary frequency, nocturnal urination.  Endo: Negative for unusual weight change.    Physical Examination:   BP 135/86   Pulse 84   Temp (!) 96.9 F (36.1 C) (Oral)   Ht 5\' 7"  (1.702 m)   Wt 251 lb 3.2 oz (113.9 kg)   LMP 09/12/2018 (Approximate)   BMI 39.34 kg/m   General: Well-nourished, well-developed in no acute distress.  Eyes: No icterus. Conjunctivae pink.   Abdomen: Bowel sounds are normal, mild upper abd tenderness, nondistended, no hepatosplenomegaly or masses, no abdominal bruits or hernia , no rebound or guarding.   Extremities: No lower extremity edema. No clubbing or deformities. Neuro: Alert and oriented x 4   Skin: Warm and dry, no jaundice. She has dry, flaky macules on the upper extremities. Macules on cheeks, more erythematous. Psych: Alert and cooperative, normal mood and affect.  Labs:   Lab Results  Component Value Date   CRP 17 (H) 09/13/2018    Lab Results  Component Value Date   ESRSEDRATE 38 (H) 09/13/2018     Imaging Studies: No results found.

## 2018-10-03 NOTE — Patient Instructions (Signed)
1. Continue bentyl as needed for abdominal pain, diarrhea.  2. We will touch base after your appointment with the rheumatologist to determine if additional GI work up needed as discussed today.

## 2018-10-03 NOTE — Assessment & Plan Note (Signed)
Very pleasant 38 y/o female with h/o IBS-D presenting for follow up. Historically she has had self-limiting IBS-D symptoms but last year she had significant change in her symptoms, with up to 10 stools daily, abdominal cramping, brbpr. Symptoms occurred in setting of significant stress. Colonoscopy with normal appearing colon/terminal ileum but random colon biopsies showed nonspecific colitis involving the rectum/colon. The month prior to her colonoscopy her sed rate was normal. Couple of days after her procedure, sed rate and CRP both significantly elevated. Currently improved but remain elevated. She has had rash, joint pain as well. Has appt with rheumatology next month.   Currently her diarrhea and abdominal pain are improved but not at her baseline. Symptoms "flare" at times, last significant "flare" in April. Started having some issues last week as well.   Her symptoms could still be related to IBS-D. It is not clear if her joint pain/rash are related to her GI symptoms. Cannot rule out evolving inflammatory bowel disease and eventually may require repeat endoscopic evaluation with repeat biopsies especially if symptoms worsen. She is not interested in pursuing repeat colonoscopy unless absolutely necessary as she is still paying off last years procedure. She will require propofol if scoped again due to "failed conscious sedation". She reports that she was awake and remembers everything including biopsies being done. She prefers to be completely sedated.   For now, await rheumatology input. Further recommendations to follow.

## 2018-10-04 NOTE — Progress Notes (Signed)
Office Visit Note  Patient: Amy Russell             Date of Birth: November 30, 1980           MRN: [id number]             PCP: Kathyrn Drown, MD Referring: Valentina Shaggy, * Visit Date: 10/18/2018 Occupation: Teacher  Subjective:  Pain in joints and urticaria.   History of Present Illness: Amy Russell is a 38 y.o. female  seen in consultation per request of Dr. Ernst Bowler.  Patient states her symptoms are started in May 2019 with diarrhea, abdominal pain, headaches and hives.  She states she tried dairy free diet without much help.  She was seen by her PCP initially and then was referred to gastroenterologist.  She states she had an ultrasound of her abdomen and colonoscopy which showed patchy colitis most likely related to NSAIDs use.  She states over time the fatigue and the joint pain persist and most the time she had joint pain with the flares of her GI symptoms.  Over the last year the symptoms have improved.  Patient states that she has been tested for celiac disease in the past which was negative.  She states she continues to have joint pain in her hands, her knees and her hip joints.  She also has achiness in her arms.  The fatigue persist.  She states she has diarrhea about 2 episodes a day now and they are controlled with Bentyl.  She was seen by an allergist allergist last year and was placed on Allegra, Zyrtec and Benadryl which helps some.  She was seen by Dr. Ernst Bowler this year who discussed possible use of Xolair.  She has not restarted taking the medication yet.  She states her symptoms get worse with the stress.  She is also not been sleeping well.  Activities of Daily Living:  Patient reports morning stiffness for 30 minutes.   Patient Denies nocturnal pain.  Difficulty dressing/grooming: Denies Difficulty climbing stairs: Denies Difficulty getting out of chair: Denies Difficulty using hands for taps, buttons, cutlery, and/or writing: Reports  Review of  Systems  Constitutional: Positive for fatigue. Negative for night sweats, weight gain and weight loss.  HENT: Negative for mouth sores, trouble swallowing, trouble swallowing, mouth dryness and nose dryness.   Eyes: Negative for pain, redness, visual disturbance and dryness.  Respiratory: Negative for cough, shortness of breath and difficulty breathing.   Cardiovascular: Negative for chest pain, palpitations, hypertension, irregular heartbeat and swelling in legs/feet.  Gastrointestinal: Positive for diarrhea. Negative for blood in stool and constipation.  Endocrine: Negative for increased urination.  Genitourinary: Negative for vaginal dryness.  Musculoskeletal: Positive for arthralgias, joint pain, myalgias, morning stiffness and myalgias. Negative for joint swelling, muscle weakness and muscle tenderness.  Skin: Positive for rash and sensitivity to sunlight. Negative for color change, hair loss, skin tightness and ulcers.  Allergic/Immunologic: Negative for susceptible to infections.  Neurological: Negative for dizziness, memory loss, night sweats and weakness.  Hematological: Negative for swollen glands.  Psychiatric/Behavioral: Positive for sleep disturbance. Negative for depressed mood. The patient is nervous/anxious.     PMFS History:  Patient Active Problem List   Diagnosis Date Noted  . Diarrhea   . Abdominal pain 09/24/2017  . GERD (gastroesophageal reflux disease) 09/24/2017  . Rectal bleeding 09/24/2017  . Chronic urticaria 09/07/2017  . Reactive airway disease 11/25/2015    Past Medical History:  Diagnosis Date  . Allergy   .  Asthma   . Chronic urticaria 09/07/2017  . GERD (gastroesophageal reflux disease)   . Recurrent upper respiratory infection (URI)     Family History  Problem Relation Age of Onset  . Colon polyps Father        possibly, unknown age   . Heart attack Father   . Prostate cancer Father   . Pancreatitis Mother        ?pancreas divisum  . Lung  cancer Mother   . Lupus Mother   . Diverticulitis Sister   . Heart attack Brother        heart transplant   . Diabetes Brother   . Asthma Sister   . Healthy Son   . Healthy Daughter   . Rheum arthritis Maternal Grandmother   . Rheum arthritis Maternal Grandfather   . Colon cancer Neg Hx   . Crohn's disease Neg Hx   . Ulcerative colitis Neg Hx    Past Surgical History:  Procedure Laterality Date  . CESAREAN SECTION  2011  . CHOLECYSTECTOMY     in college  . COLONOSCOPY N/A 10/12/2017   Dr. Oneida Alar: Normal terminal ileum, tortuous colon, hemorrhoids.  Biopsy showed proctocolitis possibly NSAID related.  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 DIVORCED.   Immunization History  Administered Date(s) Administered  . Influenza-Unspecified 12/30/2014  . Td 07/16/2003     Objective: Vital Signs: BP (!) 138/91 (BP Location: Right Arm, Patient Position: Sitting, Cuff Size: Large)   Pulse 89   Resp 14   Ht '5\' 7"'  (1.702 m)   Wt 253 lb (114.8 kg)   BMI 39.63 kg/m    Physical Exam Vitals signs and nursing note reviewed.  Constitutional:      Appearance: She is well-developed.  HENT:     Head: Normocephalic and atraumatic.  Eyes:     Conjunctiva/sclera: Conjunctivae normal.  Neck:     Musculoskeletal: Normal range of motion.  Cardiovascular:     Rate and Rhythm: Normal rate and regular rhythm.     Heart sounds: Normal heart sounds.  Pulmonary:     Effort: Pulmonary effort is normal.     Breath sounds: Normal breath sounds.  Abdominal:     General: Bowel sounds are normal.     Palpations: Abdomen is soft.  Lymphadenopathy:     Cervical: No cervical adenopathy.  Skin:    General: Skin is warm and dry.     Capillary Refill: Capillary refill takes less than 2 seconds.  Neurological:     Mental Status: She is alert and oriented to person, place, and time.  Psychiatric:        Behavior:  Behavior normal.      Musculoskeletal Exam: C-spine thoracic and lumbar spine were in good range of motion.  She had no SI joint tenderness.  Shoulder joints, elbow joints, wrist joints were in good range of motion with no tenderness.  She has DIP and PIP thickening.  She has mild tenderness over MCP joints and PIP joints.  No synovitis was noted.  Hip joints with good range of motion without discomfort.  She had no warmth swelling or effusion in her knee joints but had discomfort with range of motion.  Ankle joints MTPs PIPs DIPs with good range of motion with no synovitis.  CDAI Exam: CDAI Score: - Patient Global: -; Provider Global: - Swollen: -;  Tender: - Joint Exam   No joint exam has been documented for this visit   There is currently no information documented on the homunculus. Go to the Rheumatology activity and complete the homunculus joint exam.  Investigation: Findings:  09/13/18: Sed rate 38, CRP 17, ANA negative, RF<10  Component     Latest Ref Rng & Units 09/13/2018  Sed Rate     0 - 32 mm/hr 38 (H)  CRP     0 - 10 mg/L 17 (H)  Anti Nuclear Antibody (ANA)     Negative Negative  RA Latex Turbid.     0.0 - 13.9 IU/mL <10.0   Imaging: No results found.  Recent Labs: Lab Results  Component Value Date   WBC 10.6 10/14/2017   HGB 13.2 10/14/2017   PLT 317 10/14/2017   NA 139 09/07/2017   K 4.5 09/07/2017   CL 101 09/07/2017   CO2 24 09/07/2017   GLUCOSE 91 09/07/2017   BUN 13 09/07/2017   CREATININE 0.83 09/07/2017   BILITOT <0.2 09/07/2017   ALKPHOS 99 09/07/2017   AST 14 09/07/2017   ALT 10 09/07/2017   PROT 7.9 09/07/2017   ALBUMIN 4.2 09/07/2017   CALCIUM 9.5 09/07/2017   GFRAA 103 09/07/2017    Speciality Comments: No specialty comments available.  Procedures:  No procedures performed Allergies: Patient has no known allergies.   Assessment / Plan:     Visit Diagnoses: Pain in both hands -patient complains of pain and discomfort in her bilateral  hands.  The clinical findings are consistent with osteoarthritis.  I did not appreciate any synovitis.  Although she had tenderness across MCPs and PIPs.  I will obtain following x-rays and labs today.  Plan: XR Hand 2 View Right, XR Hand 2 View Left, x-ray findings were consistent with osteoarthritis of the hands. TSH, Cyclic citrul peptide antibody, IgG, 14-3-3 eta Protein, RNP Antibody, Anti-Smith antibody, Sjogrens syndrome-A extractable nuclear antibody, Sjogrens syndrome-B extractable nuclear antibody, Anti-DNA antibody, double-stranded, C3 and C4, HLA-B27 antigen,   Chronic pain of both knees -she complains of discomfort in her knee joints.  No warmth swelling or effusion was noted.  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  Chronic urticaria -she has been under care of Dr. Ernst Bowler.  She states she continues to have hives which get worse with anxiety and stress.  All autoimmune work-up has been negative so far.  She is chronic elevation of sedimentation rate.  Plan: Cyclic citrul peptide antibody, IgG, 14-3-3 eta Protein, RNP Antibody, Anti-Smith antibody, Sjogrens syndrome-A extractable nuclear antibody, Sjogrens syndrome-B extractable nuclear antibody, Anti-DNA antibody, double-stranded, C3 and C4,   Rectal bleeding -patient states that she has had rectal bleeding in the past but had no recent episodes.  Diarrhea, unspecified type -her diarrhea has improved on Bentyl.  She states she had colonoscopy in the past which was consistent with patchy colitis.  She is still following up with the gastroenterologist.  She also had work-up for celiac disease which was negative.  History of gastroesophageal reflux (GERD) -she has mild reflux symptoms.  Mild intermittent reactive airway disease without complication -she has intermittent asthma for which she uses inhalers.  Family history of rheumatoid  arthritis - Maternal grandmother   Family history of cutaneous lupus erythematosus - mother -patient states that her mother has patchy alopecia on her scalp because of subcutaneous lupus.  Other fatigue -  Plan: CBC with Differential/Platelet, COMPLETE METABOLIC PANEL WITH GFR, CK, TSH,   Orders: Orders Placed This Encounter  Procedures  . XR Hand 2 View Right  . XR Hand 2 View Left  . XR KNEE 3 VIEW RIGHT  . XR KNEE 3 VIEW LEFT  . CBC with Differential/Platelet  . COMPLETE METABOLIC PANEL WITH GFR  . CK  . TSH  . Cyclic citrul peptide antibody, IgG  . 14-3-3 eta Protein  . RNP Antibody  . Anti-Smith antibody  . Sjogrens syndrome-A extractable nuclear antibody  . Sjogrens syndrome-B extractable nuclear antibody  . Anti-DNA antibody, double-stranded  . C3 and C4  . HLA-B27 antigen   No orders of the defined types were placed in this encounter.   Face-to-face time spent with patient was 50 minutes. Greater than 50% of time was spent in counseling and coordination of care.  Follow-Up Instructions: Return for Polyarthralgia, urticaria.   Bo Merino, MD  Note - This record has been created using Editor, commissioning.  Chart creation errors have been sought, but may not always  have been located. Such creation errors do not reflect on  the standard of medical care.

## 2018-10-05 ENCOUNTER — Ambulatory Visit: Payer: BC Managed Care – PPO | Admitting: Nurse Practitioner

## 2018-10-18 ENCOUNTER — Encounter: Payer: Self-pay | Admitting: Rheumatology

## 2018-10-18 ENCOUNTER — Other Ambulatory Visit: Payer: Self-pay

## 2018-10-18 ENCOUNTER — Ambulatory Visit: Payer: Self-pay

## 2018-10-18 ENCOUNTER — Ambulatory Visit: Payer: BC Managed Care – PPO | Admitting: Rheumatology

## 2018-10-18 VITALS — BP 138/91 | HR 89 | Resp 14 | Ht 67.0 in | Wt 253.0 lb

## 2018-10-18 DIAGNOSIS — M25561 Pain in right knee: Secondary | ICD-10-CM

## 2018-10-18 DIAGNOSIS — R5383 Other fatigue: Secondary | ICD-10-CM

## 2018-10-18 DIAGNOSIS — M79641 Pain in right hand: Secondary | ICD-10-CM

## 2018-10-18 DIAGNOSIS — L508 Other urticaria: Secondary | ICD-10-CM

## 2018-10-18 DIAGNOSIS — Z8719 Personal history of other diseases of the digestive system: Secondary | ICD-10-CM

## 2018-10-18 DIAGNOSIS — M25562 Pain in left knee: Secondary | ICD-10-CM

## 2018-10-18 DIAGNOSIS — M79642 Pain in left hand: Secondary | ICD-10-CM | POA: Diagnosis not present

## 2018-10-18 DIAGNOSIS — G8929 Other chronic pain: Secondary | ICD-10-CM

## 2018-10-18 DIAGNOSIS — J4520 Mild intermittent asthma, uncomplicated: Secondary | ICD-10-CM

## 2018-10-18 DIAGNOSIS — R197 Diarrhea, unspecified: Secondary | ICD-10-CM

## 2018-10-18 DIAGNOSIS — Z840 Family history of diseases of the skin and subcutaneous tissue: Secondary | ICD-10-CM

## 2018-10-18 DIAGNOSIS — K625 Hemorrhage of anus and rectum: Secondary | ICD-10-CM

## 2018-10-18 DIAGNOSIS — Z8261 Family history of arthritis: Secondary | ICD-10-CM

## 2018-10-22 LAB — CBC WITH DIFFERENTIAL/PLATELET
Absolute Monocytes: 350 cells/uL (ref 200–950)
Basophils Absolute: 53 cells/uL (ref 0–200)
Basophils Relative: 0.7 %
Eosinophils Absolute: 99 cells/uL (ref 15–500)
Eosinophils Relative: 1.3 %
HCT: 38.8 % (ref 35.0–45.0)
Hemoglobin: 12.7 g/dL (ref 11.7–15.5)
Lymphs Abs: 1968 cells/uL (ref 850–3900)
MCH: 26.8 pg — ABNORMAL LOW (ref 27.0–33.0)
MCHC: 32.7 g/dL (ref 32.0–36.0)
MCV: 82 fL (ref 80.0–100.0)
MPV: 11.3 fL (ref 7.5–12.5)
Monocytes Relative: 4.6 %
Neutro Abs: 5130 cells/uL (ref 1500–7800)
Neutrophils Relative %: 67.5 %
Platelets: 269 10*3/uL (ref 140–400)
RBC: 4.73 10*6/uL (ref 3.80–5.10)
RDW: 13.9 % (ref 11.0–15.0)
Total Lymphocyte: 25.9 %
WBC: 7.6 10*3/uL (ref 3.8–10.8)

## 2018-10-22 LAB — COMPLETE METABOLIC PANEL WITH GFR
AG Ratio: 1.1 (calc) (ref 1.0–2.5)
ALT: 18 U/L (ref 6–29)
AST: 15 U/L (ref 10–30)
Albumin: 3.9 g/dL (ref 3.6–5.1)
Alkaline phosphatase (APISO): 86 U/L (ref 31–125)
BUN: 17 mg/dL (ref 7–25)
CO2: 23 mmol/L (ref 20–32)
Calcium: 9 mg/dL (ref 8.6–10.2)
Chloride: 106 mmol/L (ref 98–110)
Creat: 0.8 mg/dL (ref 0.50–1.10)
GFR, Est African American: 107 mL/min/{1.73_m2} (ref >=60)
GFR, Est Non African American: 92 mL/min/{1.73_m2} (ref >=60)
Globulin: 3.4 g/dL (calc) (ref 1.9–3.7)
Glucose, Bld: 103 mg/dL — ABNORMAL HIGH (ref 65–99)
Potassium: 3.9 mmol/L (ref 3.5–5.3)
Sodium: 138 mmol/L (ref 135–146)
Total Bilirubin: 0.3 mg/dL (ref 0.2–1.2)
Total Protein: 7.3 g/dL (ref 6.1–8.1)

## 2018-10-22 LAB — RNP ANTIBODY: Ribonucleic Protein(ENA) Antibody, IgG: <1 AI

## 2018-10-22 LAB — C3 AND C4
C3 Complement: 205 mg/dL — ABNORMAL HIGH (ref 83–193)
C4 Complement: 44 mg/dL (ref 15–57)

## 2018-10-22 LAB — 14-3-3 ETA PROTEIN: 14-3-3 eta Protein: <0.2 ng/mL (ref <0.2)

## 2018-10-22 LAB — SJOGRENS SYNDROME-A EXTRACTABLE NUCLEAR ANTIBODY: SSA (Ro) (ENA) Antibody, IgG: <1 AI

## 2018-10-22 LAB — HLA-B27 ANTIGEN: HLA-B27 Antigen: NEGATIVE

## 2018-10-22 LAB — ANTI-SMITH ANTIBODY: ENA SM Ab Ser-aCnc: <1 AI

## 2018-10-22 LAB — SJOGRENS SYNDROME-B EXTRACTABLE NUCLEAR ANTIBODY: SSB (La) (ENA) Antibody, IgG: <1 AI

## 2018-10-22 LAB — CK: Total CK: 91 U/L (ref 29–143)

## 2018-10-22 LAB — CYCLIC CITRUL PEPTIDE ANTIBODY, IGG: Cyclic Citrullin Peptide Ab: <16 UNITS

## 2018-10-22 LAB — ANTI-DNA ANTIBODY, DOUBLE-STRANDED: ds DNA Ab: <1 IU/mL

## 2018-10-22 LAB — TSH: TSH: 2.23 mIU/L

## 2018-10-24 NOTE — Progress Notes (Signed)
I will discuss labs at the follow-up visit.

## 2018-11-08 ENCOUNTER — Other Ambulatory Visit: Payer: Self-pay | Admitting: Family Medicine

## 2018-11-09 NOTE — Progress Notes (Deleted)
 Office Visit Note  Patient: Amy Russell             Date of Birth: 09/15/1980           MRN: [id number]             PCP: Luking, Scott A, MD Referring: Luking, Scott A, MD Visit Date: 11/23/2018 Occupation: @GUAROCC@  Subjective:  No chief complaint on file.   History of Present Illness: Amy Russell is a 38 y.o. female ***   Activities of Daily Living:  Patient reports morning stiffness for *** {minute/hour:19697}.   Patient {ACTIONS;DENIES/REPORTS:21021675::"Denies"} nocturnal pain.  Difficulty dressing/grooming: {ACTIONS;DENIES/REPORTS:21021675::"Denies"} Difficulty climbing stairs: {ACTIONS;DENIES/REPORTS:21021675::"Denies"} Difficulty getting out of chair: {ACTIONS;DENIES/REPORTS:21021675::"Denies"} Difficulty using hands for taps, buttons, cutlery, and/or writing: {ACTIONS;DENIES/REPORTS:21021675::"Denies"}  No Rheumatology ROS completed.   PMFS History:  Patient Active Problem List   Diagnosis Date Noted  . Diarrhea   . Abdominal pain 09/24/2017  . GERD (gastroesophageal reflux disease) 09/24/2017  . Rectal bleeding 09/24/2017  . Chronic urticaria 09/07/2017  . Reactive airway disease 11/25/2015    Past Medical History:  Diagnosis Date  . Allergy   . Asthma   . Chronic urticaria 09/07/2017  . GERD (gastroesophageal reflux disease)   . Recurrent upper respiratory infection (URI)     Family History  Problem Relation Age of Onset  . Colon polyps Father        possibly, unknown age   . Heart attack Father   . Prostate cancer Father   . Pancreatitis Mother        ?pancreas divisum  . Lung cancer Mother   . Lupus Mother   . Diverticulitis Sister   . Heart attack Brother        heart transplant   . Diabetes Brother   . Asthma Sister   . Healthy Son   . Healthy Daughter   . Rheum arthritis Maternal Grandmother   . Rheum arthritis Maternal Grandfather   . Colon cancer Neg Hx   . Crohn's disease Neg Hx   . Ulcerative colitis Neg Hx    Past  Surgical History:  Procedure Laterality Date  . CESAREAN SECTION  2011  . CHOLECYSTECTOMY     in college  . COLONOSCOPY N/A 10/12/2017   Dr. fields: Normal terminal ileum, tortuous colon, hemorrhoids.  Biopsy showed proctocolitis possibly NSAID related.  . 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 DIVORCED.   Immunization History  Administered Date(s) Administered  . Influenza-Unspecified 12/30/2014  . Td 07/16/2003     Objective: Vital Signs: There were no vitals taken for this visit.   Physical Exam   Musculoskeletal Exam: ***  CDAI Exam: CDAI Score: - Patient Global: -; Provider Global: - Swollen: -; Tender: - Joint Exam   No joint exam has been documented for this visit   There is currently no information documented on the homunculus. Go to the Rheumatology activity and complete the homunculus joint exam.  Investigation: No additional findings.  Imaging: Xr Hand 2 View Left  Result Date: 10/18/2018 PIP and DIP narrowing was noted.  No MCP, intercarpal radiocarpal joint space narrowing was noted.  No erosive changes were noted. Impression: These findings are consistent with osteoarthritis of the hand.  Xr Hand 2 View Right  Result Date: 10/18/2018 PIP and DIP narrowing was noted.  No MCP, intercarpal radiocarpal joint space narrowing was noted.    No erosive changes were noted. Impression: These findings are consistent with osteoarthritis of the hand.  Xr Knee 3 View Left  Result Date: 10/18/2018 Mild medial compartment narrowing was noted.  Mild patellofemoral narrowing was noted.  No chondrocalcinosis was noted. Impression: These findings are consistent mild osteoarthritis and mild chondromalacia patella.  Xr Knee 3 View Right  Result Date: 10/18/2018 Mild medial compartment narrowing was noted.  Mild patellofemoral narrowing was noted.  No chondrocalcinosis was noted. Impression:  These findings are consistent mild osteoarthritis and mild chondromalacia patella.   Recent Labs: Lab Results  Component Value Date   WBC 7.6 10/18/2018   HGB 12.7 10/18/2018   PLT 269 10/18/2018   NA 138 10/18/2018   K 3.9 10/18/2018   CL 106 10/18/2018   CO2 23 10/18/2018   GLUCOSE 103 (H) 10/18/2018   BUN 17 10/18/2018   CREATININE 0.80 10/18/2018   BILITOT 0.3 10/18/2018   ALKPHOS 99 09/07/2017   AST 15 10/18/2018   ALT 18 10/18/2018   PROT 7.3 10/18/2018   ALBUMIN 4.2 09/07/2017   CALCIUM 9.0 10/18/2018   GFRAA 107 10/18/2018  October 18, 2018 HLA-B27 negative, anti-CCP negative, 14 3 3 eta negative, ENA negative, CK normal, TSH normal, C3-C4 normal  Speciality Comments: No specialty comments available.  Procedures:  No procedures performed Allergies: Patient has no known allergies.   Assessment / Plan:     Visit Diagnoses: No diagnosis found.  Orders: No orders of the defined types were placed in this encounter.  No orders of the defined types were placed in this encounter.   Face-to-face time spent with patient was *** minutes. Greater than 50% of time was spent in counseling and coordination of care.  Follow-Up Instructions: No follow-ups on file.   Shaili Deveshwar, MD  Note - This record has been created using Dragon software.  Chart creation errors have been sought, but may not always  have been located. Such creation errors do not reflect on  the standard of medical care. 

## 2018-11-15 ENCOUNTER — Ambulatory Visit (INDEPENDENT_AMBULATORY_CARE_PROVIDER_SITE_OTHER): Payer: BC Managed Care – PPO | Admitting: Family Medicine

## 2018-11-15 ENCOUNTER — Other Ambulatory Visit: Payer: Self-pay

## 2018-11-15 DIAGNOSIS — B349 Viral infection, unspecified: Secondary | ICD-10-CM | POA: Diagnosis not present

## 2018-11-15 NOTE — Progress Notes (Signed)
   Subjective:    Patient ID: Amy Russell, female    DOB: Nov 25, 1980, 38 y.o.   MRN: [id number]  Cough This is a new problem. Episode onset: Saturday. The cough is non-productive. Associated symptoms comments: Doesn't feel good; joints achy (goes to rheumatologist), headache and sore throat; low grade fever of 99. Treatments tried: allergy medications. The treatment provided no relief.  Patient has had low-grade fever little bit of headache sore throat cough present over the past few days denies any other particular problems currently.  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  Location: Patient: home Provider: office   I discussed the limitations of evaluation and management by telemedicine and the availability of in person appointments. The patient expressed understanding and agreed to proceed.  History of Present Illness:    Observations/Objective:   Assessment and Plan:   Follow Up Instructions:    I discussed the assessment and treatment plan with the patient. The patient was provided an opportunity to ask questions and all were answered. The patient agreed with the plan and demonstrated an understanding of the instructions.   The patient was advised to call back or seek an in-person evaluation if the symptoms worsen or if the condition fails to improve as anticipated.  I provided 17 minutes of non-face-to-face time during this encounter.   Vicente Males, LPN    Review of Systems  Respiratory: Positive for cough.        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

## 2018-11-16 ENCOUNTER — Other Ambulatory Visit: Payer: Self-pay | Admitting: *Deleted

## 2018-11-16 DIAGNOSIS — Z20822 Contact with and (suspected) exposure to covid-19: Secondary | ICD-10-CM

## 2018-11-17 LAB — NOVEL CORONAVIRUS, NAA: SARS-CoV-2, NAA: NOT DETECTED

## 2018-11-18 ENCOUNTER — Encounter: Payer: Self-pay | Admitting: Family Medicine

## 2018-11-21 ENCOUNTER — Other Ambulatory Visit: Payer: Self-pay

## 2018-11-21 ENCOUNTER — Encounter: Payer: Self-pay | Admitting: Family Medicine

## 2018-11-21 ENCOUNTER — Ambulatory Visit (INDEPENDENT_AMBULATORY_CARE_PROVIDER_SITE_OTHER): Payer: BC Managed Care – PPO | Admitting: Family Medicine

## 2018-11-21 DIAGNOSIS — J4520 Mild intermittent asthma, uncomplicated: Secondary | ICD-10-CM

## 2018-11-21 DIAGNOSIS — J019 Acute sinusitis, unspecified: Secondary | ICD-10-CM

## 2018-11-21 MED ORDER — AMOXICILLIN-POT CLAVULANATE 875-125 MG PO TABS: ORAL_TABLET | ORAL | 0 refills | Status: DC

## 2018-11-21 MED ORDER — PREDNISONE 20 MG PO TABS: ORAL_TABLET | ORAL | 0 refills | Status: DC

## 2018-11-21 NOTE — Progress Notes (Signed)
   Subjective:  Audio plus video  Patient ID: Amy Russell, female    DOB: [birth year], 39 y.o.   MRN: [id number]  Cough This is a new problem. The cough is non-productive. Associated symptoms include headaches. Associated symptoms comments: Cough, COVID test negative, sore throat, doesn't feel well, 99.2 temp yesterday. Treatments tried: cough med, mucinex, allergy med, Tylenol  The treatment provided no relief.  pt has ZOOM meeting at 1:30pm Virtual Visit via Video Note  I connected with [name] on 11/21/18 at  1:10 PM EDT by a video enabled telemedicine application and verified that I am speaking with the correct person using two identifiers.  Location: Patient: home Provider: office   I discussed the limitations of evaluation and management by telemedicine and the availability of in person appointments. The patient expressed understanding and agreed to proceed.  History of Present Illness:    Observations/Objective:   Assessment and Plan:   Follow Up Instructions:    I discussed the assessment and treatment plan with the patient. The patient was provided an opportunity to ask questions and all were answered. The patient agreed with the plan and demonstrated an understanding of the instructions.   The patient was advised to call back or seek an in-person evaluation if the symptoms worsen or if the condition fails to improve as anticipated.  I provided 18 minutes of non-face-to-face time during this encounter. Please see prior notes.  Ongoing symptomatology.  This weekend continue to have some fever.  Cough and congestion.  Sinus frontal headache.  Notes fullness in chest.  Wheezy at times.  Having to use her inhaler more.  Vicente Males, LPN    Review of Systems  Respiratory: Positive for cough.   Neurological: Positive for headaches.       Objective:   Physical Exam   For virtual     Assessment & Plan:  Impression viral syndrome with element of  rhinosinusitis and exacerbation of asthma.  Antibiotics prescribed.  Steroids prescribed.  Warning signs discussed.  Of note COVID-19 was negative.  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 Nicki Reaper regarding long-term plans

## 2018-11-23 ENCOUNTER — Other Ambulatory Visit: Payer: Self-pay

## 2018-11-23 ENCOUNTER — Ambulatory Visit (INDEPENDENT_AMBULATORY_CARE_PROVIDER_SITE_OTHER): Payer: BC Managed Care – PPO | Admitting: Family Medicine

## 2018-11-23 ENCOUNTER — Encounter: Payer: Self-pay | Admitting: Rheumatology

## 2018-11-23 ENCOUNTER — Telehealth (INDEPENDENT_AMBULATORY_CARE_PROVIDER_SITE_OTHER): Payer: BC Managed Care – PPO | Admitting: Rheumatology

## 2018-11-23 DIAGNOSIS — J019 Acute sinusitis, unspecified: Secondary | ICD-10-CM

## 2018-11-23 DIAGNOSIS — M19041 Primary osteoarthritis, right hand: Secondary | ICD-10-CM | POA: Diagnosis not present

## 2018-11-23 DIAGNOSIS — L508 Other urticaria: Secondary | ICD-10-CM | POA: Diagnosis not present

## 2018-11-23 DIAGNOSIS — M170 Bilateral primary osteoarthritis of knee: Secondary | ICD-10-CM

## 2018-11-23 DIAGNOSIS — R197 Diarrhea, unspecified: Secondary | ICD-10-CM

## 2018-11-23 DIAGNOSIS — Z8261 Family history of arthritis: Secondary | ICD-10-CM

## 2018-11-23 DIAGNOSIS — M19042 Primary osteoarthritis, left hand: Secondary | ICD-10-CM

## 2018-11-23 DIAGNOSIS — Z8719 Personal history of other diseases of the digestive system: Secondary | ICD-10-CM

## 2018-11-23 DIAGNOSIS — J4520 Mild intermittent asthma, uncomplicated: Secondary | ICD-10-CM

## 2018-11-23 DIAGNOSIS — Z840 Family history of diseases of the skin and subcutaneous tissue: Secondary | ICD-10-CM

## 2018-11-23 DIAGNOSIS — K625 Hemorrhage of anus and rectum: Secondary | ICD-10-CM

## 2018-11-23 NOTE — Progress Notes (Signed)
   Subjective:    Patient ID: Amy Russell, female    DOB: Nov 21, 1980, 38 y.o.   MRN: [id number]  Sinusitis This is a new problem. Associated symptoms include coughing, headaches, sinus pressure and a sore throat.  was out of work all last week. Visit with dr Richardson Landry 2 days ago. Taking antibiotic and prednisone. covid test was negative. Still having sinus congestion coughing negative COVID test is concerned and worried about going back to school because of her underlying lung condition PMH asthma related issues Virtual Visit via Video Note  I connected with Amy Russell on 11/23/18 at  9:00 AM EDT by a video enabled telemedicine application and verified that I am speaking with the correct person using two identifiers.  Location: Patient: home Provider: office   I discussed the limitations of evaluation and management by telemedicine and the availability of in person appointments. The patient expressed understanding and agreed to proceed.  History of Present Illness:    Observations/Objective:   Assessment and Plan:   Follow Up Instructions:    I discussed the assessment and treatment plan with the patient. The patient was provided an opportunity to ask questions and all were answered. The patient agreed with the plan and demonstrated an understanding of the instructions.   The patient was advised to call back or seek an in-person evaluation if the symptoms worsen or if the condition fails to improve as anticipated.  I provided 17 minutes of non-face-to-face time during this encounter.        Review of Systems  HENT: Positive for sinus pressure and sore throat.   Respiratory: Positive for cough.   Neurological: Positive for headaches.       Objective:   Physical Exam  Patient had virtual visit Appears to be in no distress Atraumatic Neuro able to relate and oriented No apparent resp distress Color normal       Assessment & Plan:  Recent sinusitis  with asthma flareup finish out the medications hopefully we will see some improvement over the course of the next week  Because of her recent illnesses plus also her underlying asthma tendencies at high risk for COVID issues it is recommended for this patient to take a leave of absence from her work the next 2 weeks she will take COVID leave and then starting October 5 she will take 3 weeks of FMLA leave because of COVID she will send me an update in several weeks how she feels and whether or not she feels she can return back to work at that point

## 2018-11-23 NOTE — Progress Notes (Signed)
Virtual Visit via Video Note  I connected with Amy Russell on 11/23/18 at  3:45 PM EDT by a video enabled telemedicine application and verified that I am speaking with the correct person using two identifiers.  Location: Patient: Home  Provider: Clinic  This service was conducted via virtual visit.  Both audio and visual tools were used.  The patient was located at home. I was located in my office.  Consent was obtained prior to the virtual visit and is aware of possible charges through their insurance for this visit.  The patient is an established patient.  Dr. Estanislado Pandy, MD conducted the virtual visit and Hazel Sams, PA-C acted as scribe during the service.  Office staff helped with scheduling follow up visits after the service was conducted.   I discussed the limitations of evaluation and management by telemedicine and the availability of in person appointments. The patient expressed understanding and agreed to proceed.  CC: Chronic urticaria  History of Present Illness: Patient is a 38 year old female with a past medical history of osteoarthritis and chronic urticaria. She continues to have recurrences of urticaria.  She continues to take pepcid, singular, allegra, and zytrec as prescribed.  She uses hydrocortisone cream topically BID PRN.  She has generalized and diffuse hives.  She states she currently has a sinus infection and is taking augmentin.   She has chronic fatigue, which has worsened recently. She denies any oral or nasal ulcerations, sicca symptoms, smptoms of Raynaud's, or photosensitivity.   She has pain and stiffness in both hands. She denies any joint swelling.  She experiences discomfort with repetitive activities. She does not take OTC products for pain relief due to experiencing GI side effects.  She has intermittent discomfort in both knee joints but denies joint swelling. She denies any other joint pain or joint swelling.   Review of Systems  Constitutional: Negative  for fever and malaise/fatigue.  Eyes: Negative for photophobia, pain, discharge and redness.  Respiratory: Negative for cough, shortness of breath and wheezing.   Cardiovascular: Negative for chest pain and palpitations.  Gastrointestinal: Negative for blood in stool, constipation and diarrhea.  Genitourinary: Negative for dysuria.  Musculoskeletal: Positive for joint pain. Negative for back pain, myalgias and neck pain.       +Morning stiffness   Skin: Positive for rash (Urticaria ).  Neurological: Negative for dizziness and headaches.  Psychiatric/Behavioral: Negative for depression. The patient is not nervous/anxious and does not have insomnia.       Observations/Objective: Physical Exam  Constitutional: She is oriented to person, place, and time and well-developed, well-nourished, and in no distress.  HENT:  Head: Normocephalic and atraumatic.  Eyes: Conjunctivae are normal.  Pulmonary/Chest: Effort normal.  Neurological: She is alert and oriented to person, place, and time.  Psychiatric: Mood, memory, affect and judgment normal.   Patient reports morning stiffness for 30-45 minutes.   Patient reports nocturnal pain.  Difficulty dressing/grooming: Denies Difficulty climbing stairs: Denies Difficulty getting out of chair: Denies Difficulty using hands for taps, buttons, cutlery, and/or writing: Reports  Findings:  09/13/18: Sed rate 38, CRP 17, ANA-, RF<10  October 18, 2018 HLA-B27 negative, anti-CCP negative, _0 eta negative, ENA negative, CK normal, TSH normal, C3-C4 normal  Imaging 10/18/18: X-rays bilateral knee: These findings are consistent mild osteoarthritis and mild  chondromalacia patella. X-rays of both hands: Consistent with osteoarthritis.  No erosive changes.    Assessment and Plan: Visit Diagnoses: Primary osteoarthritis of both hands -  Clinical and radiographic findings are consistent with osteoarthritis: Autoimmune work up was negative.  She has no  clinical features of autoimmune disease.  Lab work from 10/18/18 was reviewed with the patient and all questions were addressed. She has pain and stiffness in both hands but no synovitis was noted on exam her initial visit.  No inflammation was visualized today but she is having discomfort in the DIP joints.  Joint protection and muscle strengthening were discussed.  She was advised to notify us if she develops increased joint pain or joint swelling. She will follow up as needed.    Primary osteoarthritis of both knees - Bilateral mild osteoarthritis and mild chondromalacia patella: She has intermittent pain in both knee joints.  She has no joint swelling.  We discussed the importance of lower extremity muscle strengthening.   Chronic urticaria - She is under care of Dr. Ernst Bowler.  She gives history of hives which get worse with anxiety and stress.  All autoimmune work-up has been negative. She continues to have recurrent urticaria. She takes singular, allegra, zytrec, and pepcid as prescribed.  She also uses hydrocortisone 2.5% cream BID topically.  She was advised to follow up with the allergist for further evaluation and treatment.   Diarrhea, unspecified type - Followed by GI.  She has patchy colitis.  Work-up for celiac disease was negative per patient.  Other medical conditions are listed as follows:   Rectal bleeding  History of gastroesophageal reflux (GERD)  Mild intermittent reactive airway disease without complication  Family history of rheumatoid arthritis - Maternal grandmother  Family history of cutaneous lupus erythematosus - Mother   Follow Up Instructions: She will follow up as needed.    I discussed the assessment and treatment plan with the patient. The patient was provided an opportunity to ask questions and all were answered. The patient agreed with the plan and demonstrated an understanding of the instructions.   The patient was advised to call back or seek an  in-person evaluation if the symptoms worsen or if the condition fails to improve as anticipated.  I provided 25 minutes of non-face-to-face time during this encounter.  Bo Merino, MD   Scribed by- Hazel Sams PA-C

## 2018-11-24 ENCOUNTER — Encounter: Payer: Self-pay | Admitting: Allergy & Immunology

## 2018-11-29 ENCOUNTER — Telehealth: Payer: Self-pay | Admitting: Family Medicine

## 2018-11-29 NOTE — Telephone Encounter (Signed)
Please advise. Thank you

## 2018-11-29 NOTE — Telephone Encounter (Signed)
Patient dropped off FMLA papers to be fill out today in your box for review.

## 2018-12-02 ENCOUNTER — Encounter: Payer: Self-pay | Admitting: Family Medicine

## 2018-12-02 NOTE — Telephone Encounter (Signed)
Pt contacted and states she would be fine coming in to office. Pt was transferred up front to be pre screened.

## 2018-12-02 NOTE — Telephone Encounter (Signed)
Nurses Please touch base with patient If patient is comfortable I would prefer to see her in person so I can listen to her lungs She has an appointment at 11:30 AM on Monday I would prefer that to be in person We are keeping a safe flow here in our office And not doing any sick individuals such as suspected flu or COVID- share that with her hopefully she will feel more comfortable coming to the office I do not feel she has COVID currently (Certainly if she is unlikely enough to start running fevers we request that the visit be virtual) I believe that this is more of a flare of her asthma Also we can discuss her short-term disability thank you  If she is not comfortable coming to the office we can do a virtual visit

## 2018-12-05 ENCOUNTER — Ambulatory Visit (INDEPENDENT_AMBULATORY_CARE_PROVIDER_SITE_OTHER): Payer: BC Managed Care – PPO | Admitting: Family Medicine

## 2018-12-05 ENCOUNTER — Other Ambulatory Visit: Payer: Self-pay

## 2018-12-05 DIAGNOSIS — M2550 Pain in unspecified joint: Secondary | ICD-10-CM | POA: Diagnosis not present

## 2018-12-05 DIAGNOSIS — L508 Other urticaria: Secondary | ICD-10-CM

## 2018-12-05 DIAGNOSIS — J4540 Moderate persistent asthma, uncomplicated: Secondary | ICD-10-CM | POA: Diagnosis not present

## 2018-12-05 NOTE — Telephone Encounter (Signed)
Patient coming for office visit

## 2018-12-06 NOTE — Progress Notes (Signed)
Subjective:     Patient ID: Amy Russell, female   DOB: [birth year], 38 y.o.   MRN: [id number] In person consultation HPI This person has some underlying health issues with respiratory as well as underlying asthma and allergy issues.  She has had frequent respiratory illnesses through the spring summer in the fall She is been treated several times by [place] for respiratory symptoms this fall as well as her allergist asthma Dr. She has ongoing chest congestion does not feel good finds her self at times wheezing and having to use albuterol has had to be on prednisone a couple different times Patient is not back to her baseline at this point Her allergist is planning on using a new injectable medicine to try to treat her underlying issues  This patient also has a lot of concern regarding her work situation.  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  The same students will be there multiple hours a day plus also will be eating lunch within the room.  Even though they-of the students-are social distancing  the air circulation within the room is subpar-there are no windows.    In addition to this the patient states when she wears a mask she finds it more difficult to breathe.  She also states that at times she suffers with anxiousness when wearing a mask.  This patient also suffers from frequent infections upper respiratory quite often with sinuses but she also has significant infections of the bronchioles it does make it difficult for her to keep her reactive airway/asthma under good control.  She has gone through extensive testing from asthma allergist as well as rheumatologist.  They are still unable to find specific aspects on this.  It is been very difficult for her to get this under control with medication.  They have tried multiple different medications to help  Initially she states that the teachers were offered the ability to work from home.  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  She has been told that they cannot accommodate this.  Patient also is having over the past few months significant body aches discomforts joint pains.  She saw her rheumatologist  Patient does have a PMH of reactive airway disease as well as chronic urticaria  Review of Systems  Constitutional: Positive for fatigue. Negative for activity change, diaphoresis and fever.  HENT: Positive for congestion and rhinorrhea. Negative for ear pain.   Eyes: Negative for discharge.  Respiratory: Positive for cough, shortness of breath and wheezing.   Cardiovascular: Negative for chest pain.  Gastrointestinal: Negative for abdominal pain and diarrhea.  Skin:       Intermittent hives urticaria itching   Currently with coughing shortness of breath with activity body aches    Objective:   Physical Exam Vitals signs and nursing note reviewed.  Constitutional:      Appearance: She is well-developed.  HENT:     Head: Normocephalic.     Nose: Nose normal.     Mouth/Throat:     Pharynx: No oropharyngeal exudate.  Neck:     Musculoskeletal: Neck supple.  Cardiovascular:     Rate and Rhythm: Normal rate.     Heart sounds: Normal heart sounds. No murmur.  Pulmonary:     Effort: Pulmonary effort is normal.     Breath sounds: Wheezing present.     Comments: Faint wheezes currently Lymphadenopathy:     Cervical:  No cervical adenopathy.  Skin:    General: Skin is warm and dry.        Assessment:     Frequent upper respiratory illnesses Reactive airway/asthma uncontrolled Arthralgias nonspecific     Plan:     Continue to work with asthma allergist Patient also saw rheumatology may have to get second opinion  Frequent upper respiratory illnesses along with uncontrolled asthma Given her current situation how this condition affects her she is having a very difficult time with her breathing having to use albuterol frequently.  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 Realistically speaking threat of COVID is increased for this patient until a adequate safe vaccine comes out and is available to the general public. This will not occur until summer 2021. I am recommending for the patient to stay out of work through the rest of the school year into August 2021.   25 minutes was spent with the patient.  This statement verifies that 25 minutes was indeed spent with the patient.  More than 50% of this visit-total duration of the visit-was spent in counseling and coordination of care. The issues that the patient came in for today as reflected in the diagnosis (s) please refer to documentation for further details.

## 2018-12-08 ENCOUNTER — Encounter: Payer: Self-pay | Admitting: Allergy & Immunology

## 2018-12-08 ENCOUNTER — Telehealth: Payer: Self-pay | Admitting: Allergy & Immunology

## 2018-12-08 DIAGNOSIS — J4540 Moderate persistent asthma, uncomplicated: Secondary | ICD-10-CM | POA: Insufficient documentation

## 2018-12-08 NOTE — Telephone Encounter (Signed)
Thanks got it

## 2018-12-08 NOTE — Progress Notes (Signed)
Letter written and signed.   Vonette Grosso, MD [facility]  

## 2018-12-08 NOTE — Telephone Encounter (Signed)
I wrote a letter and it is in her chart. I hope this helps! Please let me know if I can do anything else from my end.  Salvatore Marvel, MD [facility]

## 2018-12-08 NOTE — Telephone Encounter (Signed)
I called and L/m for patient have submitted for Ins approval and will reach out when approved to set up appt to start therapy. She will be buy and bill and should get approval today or tomorrow.

## 2018-12-08 NOTE — Telephone Encounter (Signed)
Letter written to support her short term disability application. I will route it to Dr. Wolfgang Phoenix so that he can include it in her application.  I would like to see Amy Russell in clinic (virtual visit is fine) so that we can discuss next steps with her immune workup. Can someone call her to schedule that appointment?   Salvatore Marvel, MD [facility]

## 2018-12-08 NOTE — Telephone Encounter (Signed)
Tweedy I am out tomorrow, can you call the patient and schedule her for an visit with Dr Ernst Bowler. It can be in office or virtual.  Thanks

## 2018-12-09 DIAGNOSIS — M2550 Pain in unspecified joint: Secondary | ICD-10-CM | POA: Insufficient documentation

## 2018-12-13 DIAGNOSIS — L501 Idiopathic urticaria: Secondary | ICD-10-CM | POA: Diagnosis not present

## 2018-12-14 ENCOUNTER — Other Ambulatory Visit: Payer: Self-pay

## 2018-12-14 ENCOUNTER — Ambulatory Visit: Payer: BC Managed Care – PPO | Admitting: Allergy & Immunology

## 2018-12-14 ENCOUNTER — Encounter: Payer: Self-pay | Admitting: Allergy & Immunology

## 2018-12-14 VITALS — BP 162/100 | HR 85 | Temp 98.2°F | Resp 18 | Ht 67.0 in

## 2018-12-14 DIAGNOSIS — J302 Other seasonal allergic rhinitis: Secondary | ICD-10-CM

## 2018-12-14 DIAGNOSIS — J3089 Other allergic rhinitis: Secondary | ICD-10-CM | POA: Diagnosis not present

## 2018-12-14 DIAGNOSIS — J4540 Moderate persistent asthma, uncomplicated: Secondary | ICD-10-CM

## 2018-12-14 DIAGNOSIS — L501 Idiopathic urticaria: Secondary | ICD-10-CM | POA: Diagnosis not present

## 2018-12-14 DIAGNOSIS — L508 Other urticaria: Secondary | ICD-10-CM

## 2018-12-14 DIAGNOSIS — B999 Unspecified infectious disease: Secondary | ICD-10-CM

## 2018-12-14 MED ORDER — BUDESONIDE-FORMOTEROL FUMARATE 80-4.5 MCG/ACT IN AERO: 2.0000 | INHALATION_SPRAY | Freq: Two times a day (BID) | RESPIRATORY_TRACT | 5 refills | Status: DC

## 2018-12-14 MED ORDER — OMALIZUMAB 150 MG ~~LOC~~ SOLR
300.0000 mg | SUBCUTANEOUS | Status: DC
  Administered 2018-12-14 – 2020-06-05 (×19): 300 mg via SUBCUTANEOUS

## 2018-12-14 NOTE — Progress Notes (Signed)
FOLLOW UP  Date of Service/Encounter:  12/14/18   Assessment:   Chronic urticaria - in the setting of chronic abdominal pain and negative testing to the most common food allergens  Seasonal and perennial allergic rhinitis(trees, weeds, indoor molds, dust mites and cockroach)  Plan/Recommendations:   1. Chronic urticaria - We are starting Xolair today. - Sometimes it can take 2-3 doses to have an effect,so be patient.  - In the meantime, continue suppressive dosing of antihistamines:   - Morning: Allegra (fexofenadine) 180-387m (one to two tablets)   - Evening: Zyrtec (cetirizine) 10-27m(one to two tablets) + Singulair (montelukast) 1046m Pepcid 8m64mily  - You can change this dosing at home, decreasing the dose as needed or increasing the dosing as needed.   2. Seasonal and perennial allergic rhinitis (trees, weeds, indoor molds, dust mites and cockroach) - Continue with: Zyrtec (cetirizine) 10mg74mlet once daily, Singulair (montelukast) 10mg 20my and Flonase (fluticasone) one spray per nostril daily - You can use an extra dose of the antihistamine, if needed, for breakthrough symptoms.  - Consider nasal saline rinses 1-2 times daily to remove allergens from the nasal cavities as well as help with mucous clearance (this is especially helpful to do before the nasal sprays are given) - Consider allergy shots as a means of long-term control.  3. Recurrent infections - We are going to get some screening labs to make sure that your immune system is intact. - We will contact you in 1-2 weeks with the results of the testing.  4. Moderate persistent asthma - Lung testing looked great today. - We are going to add on a combined inhaled steroid and long acting bronchodilator (Symbicort). - Spacer sample and demonstration provided. - Daily controller medication(s): Symbicort 80/4.5mcg t93mpuffs twice daily with spacer - Prior to physical activity: albuterol 2 puffs 10-15  minutes before physical activity. - Rescue medications: albuterol 4 puffs every 4-6 hours as needed - Asthma control goals:  * Full participation in all desired activities (may need albuterol before activity) * Albuterol use two time or less a week on average (not counting use with activity) * Cough interfering with sleep two time or less a month * Oral steroids no more than once a year * No hospitalizations  5. Return in about 3 months (around 03/16/2019). This can be an in-person, a virtual Webex or a telephone follow up visit.  Subjective:   Amy DEFRANCO1 y.o.32emale presenting today for follow up of  Chief Complaint  Patient presents with  . Urticaria    Amy JERGENShistory of the following: Patient Active Problem List   Diagnosis Date Noted  . Arthralgia 12/09/2018  . Uncontrolled moderate persistent asthma 12/08/2018  . Diarrhea   . Abdominal pain 09/24/2017  . GERD (gastroesophageal reflux disease) 09/24/2017  . Rectal bleeding 09/24/2017  . Chronic urticaria 09/07/2017  . Reactive airway disease 11/25/2015    History obtained from: chart review and patient.  HeatherBronwen1 y.o.28emale presenting for a follow up visit.  She was last seen in July 2020.  At that time, she continued to have problems with chronic urticaria.  We did get a repeat CRP and ESR because they have been elevated in the past.  We also got an ANA and a rheumatoid factor.  We referred her to see Rheumatology.  We continued Allegra 1 to 2 tablets in the morning and Zyrtec 1 to 2 tablets  at night in combination with montelukast 10 mg daily.  For her rhinitis, we continued with the antihistamines as well as Flonase.  Asthma/Respiratory Symptom History: She has had asthma for quite some time. She did have albuterol which she has been using more frequently. THis has been managed by her PCP. She did have a coughing fit last night and she had emesis. She does this three times per week. She  was on Flovent in the distant past. She was on it as recently as last year. Typically she took it during the fall and possibly winter months. This was adult onset asthma. She does rarely wake up at night coughing. She is treated for bronchitis often in the fall/winter.   Allergic Rhinitis Symptom History: She is using montelukast daily which is helping with her symptoms. She has not needed any antibiotics at all for infections. She does not use a nasal spray at all.   Urticaria Symptom History: She continues to have hives despite all of these issues. She has had hives for over one year now.  She has been using upwards of 4x the routine dosing of antihistamines with only minimal relief. She mostly has these outbreaks on her face as well as shoulders and upper arms. These are raised and amorphous lesions that are blanchable. She has seen Rheumatology, but per the patient, the MD did not think that her hives were related to any kind of Rheumatologic process. She is open to starting Xolair today.   Infection Symptom History: Amy Russell does have multiple infections. She tends to get "chronic bronchitis" often as well as sinus infections. She does not have any history of needing IV antibiotics and she has not needed any hospitalizations for infections. She has never had any type of immune workup.   Her mother hs cutaneous lupus and her grandmother had RA. Her mother had lung cancer twice, but has never been a smoker. 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 Otherwise there is no history of autoimmunity in the family.   Otherwise, there have been no changes to her past medical history, surgical history, family history, or social history.    Review of Systems  Constitutional: Positive for malaise/fatigue. Negative for chills, fever and weight loss.  HENT: Negative.  Negative for congestion, ear discharge and ear pain.   Eyes: Negative for pain, discharge and redness.  Respiratory: Positive for  cough, shortness of breath and wheezing. Negative for sputum production.   Cardiovascular: Negative.  Negative for chest pain and palpitations.  Gastrointestinal: Negative for abdominal pain, constipation, diarrhea, heartburn, nausea and vomiting.  Skin: Positive for itching and rash.  Neurological: Negative for dizziness and headaches.  Endo/Heme/Allergies: Negative for environmental allergies. Does not bruise/bleed easily.       Objective:   Blood pressure (!) 162/100, pulse 85, temperature 98.2 F (36.8 C), temperature source Temporal, resp. rate 18, height _0  (1.702 m), SpO2 95 %. Body mass index is 39.63 kg/m.   Physical Exam:  Physical Exam  Constitutional: She appears well-developed.  HENT:  Head: Normocephalic and atraumatic.  Right Ear: Tympanic membrane, external ear and ear canal normal.  Left Ear: Tympanic membrane and ear canal normal.  Nose: No mucosal edema, rhinorrhea, nasal deformity or septal deviation. No epistaxis. Right sinus exhibits no maxillary sinus tenderness and no frontal sinus tenderness. Left sinus exhibits no maxillary sinus tenderness and no frontal sinus tenderness.  Mouth/Throat: Uvula is midline and oropharynx is clear and moist. Mucous membranes are  not pale and not dry.  Eyes: Pupils are equal, round, and reactive to light. Conjunctivae and EOM are normal. Right eye exhibits no chemosis and no discharge. Left eye exhibits no chemosis and no discharge. Right conjunctiva is not injected. Left conjunctiva is not injected.  Cardiovascular: Normal rate, regular rhythm and normal heart sounds.  Respiratory: Effort normal and breath sounds normal. No accessory muscle usage. No tachypnea. No respiratory distress. She has no wheezes. She has no rhonchi. She has no rales. She exhibits no tenderness.  Moving air well in all lung fields. No increased work of breathing.   Lymphadenopathy:    She has no cervical adenopathy.  Neurological: She is alert.   Skin: No abrasion, no petechiae and no rash noted. Rash is not papular, not vesicular and not urticarial. No erythema. No pallor.  She does have some raised papular lesions on her face, especially notable under her mask. She also has some lesions that are on her upper shoulders.   Psychiatric: She has a normal mood and affect.     Diagnostic studies:   Spirometry: results normal (FEV1: 3.12/103%, FVC: 4.34/112%, FEV1/FVC: 71%).    Spirometry consistent with normal pattern.   Allergy Studies: none        Salvatore Marvel, MD  [facility]

## 2018-12-14 NOTE — Patient Instructions (Addendum)
1. Chronic urticaria - We are starting Xolair today. - Sometimes it can take 2-3 doses to have an effect,so be patient.  - In the meantime, continue suppressive dosing of antihistamines:   - Morning: Allegra (fexofenadine) 180-360mg  (one to two tablets)   - Evening: Zyrtec (cetirizine) 10-20mg  (one to two tablets) + Singulair (montelukast) 10mg  + Pepcid 40mg  daily  - You can change this dosing at home, decreasing the dose as needed or increasing the dosing as needed.    2. Seasonal and perennial allergic rhinitis (trees, weeds, indoor molds, dust mites and cockroach) - Continue with: Zyrtec (cetirizine) 10mg  tablet once daily, Singulair (montelukast) 10mg  daily and Flonase (fluticasone) one spray per nostril daily - You can use an extra dose of the antihistamine, if needed, for breakthrough symptoms.  - Consider nasal saline rinses 1-2 times daily to remove allergens from the nasal cavities as well as help with mucous clearance (this is especially helpful to do before the nasal sprays are given) - Consider allergy shots as a means of long-term control.  3. Recurrent infections - We are going to get some screening labs to make sure that your immune system is intact. - We will contact you in 1-2 weeks with the results of the testing.  4. Moderate persistent asthma - Lung testing looked great today. - We are going to add on a combined inhaled steroid and long acting bronchodilator (Symbicort). - Spacer sample and demonstration provided. - Daily controller medication(s): Symbicort 80/4.9mcg two puffs twice daily with spacer - Prior to physical activity: albuterol 2 puffs 10-15 minutes before physical activity. - Rescue medications: albuterol 4 puffs every 4-6 hours as needed - Asthma control goals:  * Full participation in all desired activities (may need albuterol before activity) * Albuterol use two time or less a week on average (not counting use with activity) * Cough interfering with  sleep two time or less a month * Oral steroids no more than once a year * No hospitalizations  5. Return in about 3 months (around 03/16/2019). This can be an in-person, a virtual Webex or a telephone follow up visit.   Please inform us of any Emergency Department visits, hospitalizations, or changes in symptoms. Call us before going to the ED for breathing or allergy symptoms since we might be able to fit you in for a sick visit. Feel free to contact us anytime with any questions, problems, or concerns.  It was a pleasure to see you again today!   Websites that have reliable patient information: 1. American Academy of Asthma, Allergy, and Immunology: www.aaaai.org 2. Food Allergy Research and Education (FARE): foodallergy.org 3. Mothers of Asthmatics: http://www.asthmacommunitynetwork.org 4. American College of Allergy, Asthma, and Immunology: www.acaai.org  "Like" [place] on Facebook and Instagram for our latest updates!      Make sure you are registered to vote! If you have moved or changed any of your contact information, you will need to get this updated before voting!  In some cases, you MAY be able to register to vote online: [email or web address]    Voter ID laws are NOT going into effect for the General Election in November 2020! DO NOT let this stop you from exercising your right to vote!   Absentee voting is the SAFEST way to vote during the coronavirus pandemic!   Download and print an absentee ballot request form at [email or web address] or you can scan the QR code below with your smart phone:      More information on absentee  ballots can be found here: [email or web address]

## 2018-12-14 NOTE — Telephone Encounter (Signed)
Called and spoke to patient will go ahead and get her started today at her visit with Dr Ernst Bowler in [place]

## 2018-12-15 ENCOUNTER — Encounter: Payer: Self-pay | Admitting: Allergy & Immunology

## 2018-12-15 MED ORDER — EPINEPHRINE 0.3 MG/0.3ML IJ SOAJ: 0.3000 mg | Freq: Once | INTRAMUSCULAR | 1 refills | Status: AC

## 2018-12-16 ENCOUNTER — Telehealth: Payer: Self-pay | Admitting: Gastroenterology

## 2018-12-16 NOTE — Telephone Encounter (Signed)
Please let pt know I reviewed rheumatology records. No additional gi work up at this time.   Advise [facility] only follow up, nonurgent.

## 2018-12-22 LAB — HAEMOPHILIUS INFLUENZAE B AB IGG: Influenza B Virus Ab, IgG: 2.43 ug/mL

## 2018-12-22 LAB — COMPLEMENT, TOTAL: Compl, Total (CH50): >60 U/mL (ref >41)

## 2018-12-22 LAB — STREP PNEUMONIAE 23 SEROTYPES IGG
Pneumo Ab Type 1*: 2.3 ug/mL (ref >1.3)
Pneumo Ab Type 12 (12F)*: 0.2 ug/mL — ABNORMAL LOW (ref >1.3)
Pneumo Ab Type 14*: 2.2 ug/mL (ref >1.3)
Pneumo Ab Type 17 (17F)*: 0.5 ug/mL — ABNORMAL LOW (ref >1.3)
Pneumo Ab Type 19 (19F)*: 9 ug/mL (ref >1.3)
Pneumo Ab Type 2*: 1.6 ug/mL (ref >1.3)
Pneumo Ab Type 20*: 4.9 ug/mL (ref >1.3)
Pneumo Ab Type 22 (22F)*: 0.7 ug/mL — ABNORMAL LOW (ref >1.3)
Pneumo Ab Type 23 (23F)*: 5.4 ug/mL (ref >1.3)
Pneumo Ab Type 26 (6B)*: 0.2 ug/mL — ABNORMAL LOW (ref >1.3)
Pneumo Ab Type 3*: 7 ug/mL (ref >1.3)
Pneumo Ab Type 34 (10A)*: 1.1 ug/mL — ABNORMAL LOW (ref >1.3)
Pneumo Ab Type 4*: 1.2 ug/mL — ABNORMAL LOW (ref >1.3)
Pneumo Ab Type 43 (11A)*: 2 ug/mL (ref >1.3)
Pneumo Ab Type 5*: 1.8 ug/mL (ref >1.3)
Pneumo Ab Type 51 (7F)*: 1 ug/mL — ABNORMAL LOW (ref >1.3)
Pneumo Ab Type 54 (15B)*: 4.9 ug/mL (ref >1.3)
Pneumo Ab Type 56 (18C)*: >16.7 ug/mL (ref >1.3)
Pneumo Ab Type 57 (19A)*: 11.5 ug/mL (ref >1.3)
Pneumo Ab Type 68 (9V)*: 0.9 ug/mL — ABNORMAL LOW (ref >1.3)
Pneumo Ab Type 70 (33F)*: 1.3 ug/mL — ABNORMAL LOW (ref >1.3)
Pneumo Ab Type 8*: 19.6 ug/mL (ref >1.3)
Pneumo Ab Type 9 (9N)*: 0.4 ug/mL — ABNORMAL LOW (ref >1.3)

## 2018-12-22 LAB — DIPHTHERIA / TETANUS ANTIBODY PANEL
Diphtheria Ab: <0.1 IU/mL — ABNORMAL LOW (ref <0.10)
Tetanus Ab, IgG: 0.5 IU/mL (ref <0.10)

## 2018-12-22 LAB — IGG, IGA, IGM
IgA/Immunoglobulin A, Serum: 451 mg/dL — ABNORMAL HIGH (ref 87–352)
IgG (Immunoglobin G), Serum: 1169 mg/dL (ref 586–1602)
IgM (Immunoglobulin M), Srm: 84 mg/dL (ref 26–217)

## 2018-12-23 NOTE — Telephone Encounter (Signed)
Pt is aware and forwarding to [facility] to schedule the follow up with Dr. Oneida Alar.

## 2018-12-25 ENCOUNTER — Telehealth: Payer: Self-pay | Admitting: Family Medicine

## 2018-12-25 NOTE — Telephone Encounter (Signed)
Patient had recent evaluation from allergist The allergist recommends pneumococcal 23 and a Tdap  Please contact patient help set up a nurse visit to do these immunizations thank you  Also if she has not had a flu vaccine she needs this as well

## 2018-12-26 ENCOUNTER — Encounter: Payer: Self-pay | Admitting: Internal Medicine

## 2018-12-26 ENCOUNTER — Encounter: Payer: Self-pay | Admitting: Allergy & Immunology

## 2018-12-26 NOTE — Telephone Encounter (Signed)
Patient scheduled and letter sent  °

## 2018-12-26 NOTE — Telephone Encounter (Signed)
Please call and schedule a nurse visit for these vaccines. thanks

## 2019-01-04 ENCOUNTER — Other Ambulatory Visit (INDEPENDENT_AMBULATORY_CARE_PROVIDER_SITE_OTHER): Payer: BC Managed Care – PPO | Admitting: *Deleted

## 2019-01-04 ENCOUNTER — Other Ambulatory Visit: Payer: Self-pay

## 2019-01-04 DIAGNOSIS — Z23 Encounter for immunization: Secondary | ICD-10-CM | POA: Diagnosis not present

## 2019-01-10 DIAGNOSIS — L501 Idiopathic urticaria: Secondary | ICD-10-CM | POA: Diagnosis not present

## 2019-01-11 ENCOUNTER — Ambulatory Visit (INDEPENDENT_AMBULATORY_CARE_PROVIDER_SITE_OTHER): Payer: BC Managed Care – PPO

## 2019-01-11 DIAGNOSIS — L501 Idiopathic urticaria: Secondary | ICD-10-CM

## 2019-01-25 ENCOUNTER — Ambulatory Visit: Payer: Self-pay

## 2019-02-01 ENCOUNTER — Ambulatory Visit: Payer: BC Managed Care – PPO | Admitting: Gastroenterology

## 2019-02-07 DIAGNOSIS — J4540 Moderate persistent asthma, uncomplicated: Secondary | ICD-10-CM | POA: Diagnosis not present

## 2019-02-08 ENCOUNTER — Other Ambulatory Visit: Payer: Self-pay

## 2019-02-08 ENCOUNTER — Ambulatory Visit (INDEPENDENT_AMBULATORY_CARE_PROVIDER_SITE_OTHER): Payer: BC Managed Care – PPO

## 2019-02-08 DIAGNOSIS — J4540 Moderate persistent asthma, uncomplicated: Secondary | ICD-10-CM

## 2019-03-07 DIAGNOSIS — L501 Idiopathic urticaria: Secondary | ICD-10-CM | POA: Diagnosis not present

## 2019-03-08 ENCOUNTER — Ambulatory Visit (INDEPENDENT_AMBULATORY_CARE_PROVIDER_SITE_OTHER): Payer: BC Managed Care – PPO

## 2019-03-08 DIAGNOSIS — L501 Idiopathic urticaria: Secondary | ICD-10-CM | POA: Diagnosis not present

## 2019-03-17 ENCOUNTER — Other Ambulatory Visit: Payer: Self-pay

## 2019-03-17 ENCOUNTER — Encounter: Payer: Self-pay | Admitting: Allergy & Immunology

## 2019-03-17 ENCOUNTER — Ambulatory Visit: Payer: BC Managed Care – PPO | Admitting: Allergy & Immunology

## 2019-03-17 VITALS — BP 132/86 | HR 98 | Temp 98.1°F | Resp 20

## 2019-03-17 DIAGNOSIS — J3089 Other allergic rhinitis: Secondary | ICD-10-CM

## 2019-03-17 DIAGNOSIS — J302 Other seasonal allergic rhinitis: Secondary | ICD-10-CM

## 2019-03-17 DIAGNOSIS — L508 Other urticaria: Secondary | ICD-10-CM

## 2019-03-17 DIAGNOSIS — B999 Unspecified infectious disease: Secondary | ICD-10-CM

## 2019-03-17 DIAGNOSIS — J4540 Moderate persistent asthma, uncomplicated: Secondary | ICD-10-CM

## 2019-03-17 NOTE — Progress Notes (Signed)
FOLLOW UP  Date of Service/Encounter:  03/17/19   Assessment:   Chronic urticaria - improved on Xolair but with breakthrough urticaria closer to each Xolair injection  Seasonal and perennial allergic rhinitis(trees, weeds, indoor molds, dust mites and cockroach)  Seeking disabled status - application pending   Moderate persistent asthma, uncomplicated - with improved control due to Xolair   Plan/Recommendations:   1. Chronic urticaria - We will continue with Xolair, but we will work to get it approved for every two weeks.  - Tammy will work on that.  - we could consider changing to Dupixent if needed.  - In the meantime, continue suppressive dosing of antihistamines:   - Morning: Allegra (fexofenadine) 360mg  (two tablets)   - Evening: Zyrtec (cetirizine) 20mg  (two tablets) + Singulair (montelukast) 10mg  + Pepcid 40mg  daily  - You can change this dosing at home, decreasing the dose as needed or increasing the dosing as needed.   2. Seasonal and perennial allergic rhinitis (trees, weeds, indoor molds, dust mites and cockroach) - Continue with: Zyrtec (cetirizine) 10mg  tablet once daily, Singulair (montelukast) 10mg  daily and Flonase (fluticasone) one spray per nostril daily  - Spray the Flonase towards the sides of the nose. - You can use an extra dose of the antihistamine, if needed, for breakthrough symptoms.  - Consider nasal saline rinses 1-2 times daily to remove allergens from the nasal cavities as well as help with mucous clearance (this is especially helpful to do before the nasal sprays are given) - Consider allergy shots as a means of long-term control.  3. Recurrent infections  - We are going to get a repeat Pneumococcal titers to make sure that your immune system responded well to the Pneumovax.  - We will contact you in 1-2 weeks with the results of the testing.   4. Moderate persistent asthma - Lung testing looked great today. - Spacer sample and  demonstration provided. - Daily controller medication(s): Symbicort 80/4.8mcg two puffs twice daily with spacer + Xolair  - Prior to physical activity: albuterol 2 puffs 10-15 minutes before physical activity. - Rescue medications: albuterol 4 puffs every 4-6 hours as needed - Asthma control goals:  * Full participation in all desired activities (may need albuterol before activity) * Albuterol use two time or less a week on average (not counting use with activity) * Cough interfering with sleep two time or less a month * Oral steroids no more than once a year * No hospitalizations  5. Return in about 4 months (around 07/15/2019). This can be an in-person, a virtual Webex or a telephone follow up visit.   Subjective:   Amy Russell is a 39 y.o. female presenting today for follow up of  Chief Complaint  Patient presents with  . Asthma    Cough, but much better than before  . Cold Extremity    Very difficult to warm up hands after getting cold.     Amy Russell has a history of the following: Patient Active Problem List   Diagnosis Date Noted  . Arthralgia 12/09/2018  . Uncontrolled moderate persistent asthma 12/08/2018  . Diarrhea   . Abdominal pain 09/24/2017  . GERD (gastroesophageal reflux disease) 09/24/2017  . Rectal bleeding 09/24/2017  . Chronic urticaria 09/07/2017  . Reactive airway disease 11/25/2015    History obtained from: chart review and patient.  Amy Russell is a 39 y.o. female presenting for a follow up visit.  She was last seen in October 2020.  At  that time, we started her on Xolair for her chronic urticaria.  We continued her on suppressive doses of antihistamines.  Since the last visit, she has done well. She did apply for disability and she thinks that she was approved. She remains on leave from school and is working hard to keep her son educated via [employer] school.   Asthma/Respiratory Symptom History: She remains on the Xolair twice daily. She  remains on the Symbicort 80/4.5 two puffs twice daily. She has been using her albuterol more lately, although she is unsure why. She reports that the she has been sleeping well without any nighttime awakenings due to coughing/wheezing. She does feel that the addition of the Xolair has helped with her asthma control.   Allergic Rhinitis Symptom History: She did stop her nose spray because she was developing some nose bleeds with them. She has been aiming straight up instead of the sides, however. She is going to try to change her technique to see if this helps. She remains on the cetirizine and the montelukast. She has not needed antibiotics at all since the last visit.   Urticaria Symptom History: Her hives are better with the Xolair, but she did have some itching over the last week. Typically she does well right after the injection. She will have some popping out between injections. She has been having some worsening breakouts on her face. She is open to increasing to twice monthly to see if this helps at all.   She did get a Pneumovax, but has not gotten repeat titers. She has not needed antibiotics at all since the last visit.   Otherwise, there have been no changes to her past medical history, surgical history, family history, or social history.    Review of Systems  Constitutional: Negative.  Negative for chills, fever, malaise/fatigue and weight loss.  HENT: Negative.  Negative for congestion, ear discharge, ear pain and sore throat.   Eyes: Negative for pain, discharge and redness.  Respiratory: Negative for cough, sputum production, shortness of breath and wheezing.   Cardiovascular: Negative.  Negative for chest pain and palpitations.  Gastrointestinal: Negative for abdominal pain, constipation, diarrhea, heartburn, nausea and vomiting.  Skin: Negative.  Negative for itching and rash.  Neurological: Negative for dizziness and headaches.  Endo/Heme/Allergies: Negative for environmental  allergies. Does not bruise/bleed easily.       Objective:   Blood pressure 132/86, pulse 98, temperature 98.1 F (36.7 C), temperature source Temporal, resp. rate 20, SpO2 97 %. There is no height or weight on file to calculate BMI.   Physical Exam:  Physical Exam  Constitutional: She appears well-developed.  Pleasant female. Seems more together and less anxious today than previous interactions with her. t  HENT:  Head: Normocephalic and atraumatic.  Right Ear: Tympanic membrane, external ear and ear canal normal.  Left Ear: Tympanic membrane, external ear and ear canal normal.  Nose: Mucosal edema and rhinorrhea present. No nasal deformity or septal deviation. No epistaxis. Right sinus exhibits no maxillary sinus tenderness and no frontal sinus tenderness. Left sinus exhibits no maxillary sinus tenderness and no frontal sinus tenderness.  Mouth/Throat: Uvula is midline and oropharynx is clear and moist. Mucous membranes are not pale and not dry.  There is some mild cobblestoning present.   Eyes: Pupils are equal, round, and reactive to light. Conjunctivae and EOM are normal. Right eye exhibits no chemosis and no discharge. Left eye exhibits no chemosis and no discharge. Right conjunctiva is not injected. Left  conjunctiva is not injected.  Cardiovascular: Normal rate, regular rhythm and normal heart sounds.  Respiratory: Effort normal and breath sounds normal. No accessory muscle usage. No tachypnea. No respiratory distress. She has no wheezes. She has no rhonchi. She has no rales. She exhibits no tenderness.  Moving air well in all lung fields. No increased work of breathing noted.   Lymphadenopathy:    She has no cervical adenopathy.  Neurological: She is alert.  Skin: No abrasion, no petechiae and no rash noted. Rash is not papular, not vesicular and not urticarial. No erythema. No pallor.  She does have some faint erythematous patches on her face. Otherwise, she does not have any  urticarial or eczematous lesions.   Psychiatric: She has a normal mood and affect.     Diagnostic studies:    Spirometry: results normal (FEV1: 3.10/103%, FVC: 3.81/98%, FEV1/FVC: 81%).    Spirometry consistent with normal pattern.   Allergy Studies: none        Salvatore Marvel, MD  [facility]

## 2019-03-17 NOTE — Patient Instructions (Addendum)
1. Chronic urticaria - We will continue with Xolair, but we will work to get it approved for every two weeks.  - Tammy will work on that.  - we could consider changing to Dupixent if needed.  - In the meantime, continue suppressive dosing of antihistamines:   - Morning: Allegra (fexofenadine) 360mg  (two tablets)   - Evening: Zyrtec (cetirizine) 20mg  (two tablets) + Singulair (montelukast) 10mg  + Pepcid 40mg  daily  - You can change this dosing at home, decreasing the dose as needed or increasing the dosing as needed.   2. Seasonal and perennial allergic rhinitis (trees, weeds, indoor molds, dust mites and cockroach) - Continue with: Zyrtec (cetirizine) 10mg  tablet once daily, Singulair (montelukast) 10mg  daily and Flonase (fluticasone) one spray per nostril daily  - Spray the Flonase towards the sides of the nose. - You can use an extra dose of the antihistamine, if needed, for breakthrough symptoms.  - Consider nasal saline rinses 1-2 times daily to remove allergens from the nasal cavities as well as help with mucous clearance (this is especially helpful to do before the nasal sprays are given) - Consider allergy shots as a means of long-term control.  3. Recurrent infections  - We are going to get a repeat Pneumococcal titers to make sure that your immune system responded well to the Pneumovax.  - We will contact you in 1-2 weeks with the results of the testing.   4. Moderate persistent asthma - Lung testing looked great today. - Spacer sample and demonstration provided. - Daily controller medication(s): Symbicort 80/4.10mcg two puffs twice daily with spacer + Xolair  - Prior to physical activity: albuterol 2 puffs 10-15 minutes before physical activity. - Rescue medications: albuterol 4 puffs every 4-6 hours as needed - Asthma control goals:  * Full participation in all desired activities (may need albuterol before activity) * Albuterol use two time or less a week on average (not  counting use with activity) * Cough interfering with sleep two time or less a month * Oral steroids no more than once a year * No hospitalizations  5. Return in about 4 months (around 07/15/2019). This can be an in-person, a virtual Webex or a telephone follow up visit.   Please inform of any Emergency Department visits, hospitalizations, or changes in symptoms. Call before going to the ED for breathing or allergy symptoms since we might be able to fit you in for a sick visit. Feel free to contact anytime with any questions, problems, or concerns.  It was a pleasure to see you again today! Congrats on your application getting accepted!   Websites that have reliable patient information: 1. American Academy of Asthma, Allergy, and Immunology: www.aaaai.org 2. Food Allergy Research and Education (FARE): foodallergy.org 3. Mothers of Asthmatics: http://www.asthmacommunitynetwork.org 4. American College of Allergy, Asthma, and Immunology: www.acaai.org  "Like" on Facebook and Instagram for our latest updates!        Make sure you are registered to vote! If you have moved or changed any of your contact information, you will need to get this updated before voting!  In some cases, you MAY be able to register to vote online: 4m

## 2019-03-19 ENCOUNTER — Encounter: Payer: Self-pay | Admitting: Allergy & Immunology

## 2019-03-23 ENCOUNTER — Ambulatory Visit (INDEPENDENT_AMBULATORY_CARE_PROVIDER_SITE_OTHER): Payer: BC Managed Care – PPO | Admitting: Gastroenterology

## 2019-03-23 ENCOUNTER — Other Ambulatory Visit: Payer: Self-pay

## 2019-03-23 ENCOUNTER — Encounter: Payer: Self-pay | Admitting: Gastroenterology

## 2019-03-23 DIAGNOSIS — R197 Diarrhea, unspecified: Secondary | ICD-10-CM

## 2019-03-23 MED ORDER — DICYCLOMINE HCL 10 MG PO CAPS: ORAL_CAPSULE | ORAL | 3 refills | Status: DC

## 2019-03-23 NOTE — Patient Instructions (Addendum)
DRINK WATER TO KEEP YOUR URINE LIGHT YELLOW.  FOLLOW A LOW FAT DIET. MEATS SHOULD BE BAKED, BROILED, OR BOILED. AVOID FRIED FOOD. AVOID FAST FOODS. SEE INFO BELOW.   EAT TO LIVE AND THINK OF FOOD AS MEDICINE. 75% OF YOUR PLATE SHOULD BE FRUITS/VEGGIES.  To have more energy, and to lose weight:      1. CONTINUE YOUR WEIGHT LOSS EFFORTS. I RECOMMEND YOU READ AND FOLLOW RECOMMENDATIONS BY DR. MARK HYMAN, "10-DAY DETOX DIET".    2. If you must eat bread, EAT EZEKIEL BREAD. IT IS IN THE FROZEN SECTION OF THE GROCERY STORE.    3. DRINK WATER WITH FRUIT OR CUCUMBER ADDED. YOUR URINE SHOULD BE LIGHT YELLOW. AVOID SODA, GATORADE, ENERGY DRINKS, OR DIET SODA.     4. AVOID HIGH FRUCTOSE CORN SYRUP AND CAFFEINE.     5. DO NOT chew SUGAR FREE GUM OR USE ARTIFICIAL SWEETENERS. IF NEEDED USE STEVIA AS A SWEETENER.    6. DO NOT EAT ENRICHED WHEAT FLOUR, PASTA, RICE, OR CEREAL.    7. ONLY EAT WILD CAUGHT SEAFOOD, GRASS FED BEEF OR CHICKEN, PORK FROM PASTURE RAISE PIGS, OR EGGS FROM PASTURE RAISED CHICKENS.    8. CONTINUE BEDTIME YOGA OR PRACTICE CHAIR YOGA FOR 15-30 MINS 3 OR 4 TIMES A WEEK AND PROGRESS TO HATHA YOGA OVER NEXT 6 MOS.    9. CONTINUE VITAMIN D3 AT LEAST 2000 IU DAILY.   ADDITIONAL SUPPLEMENTS TO DECREASE CRAVING AND SUPPRESS YOUR APPETITE:    1. CINNAMON 500 MG EVERY AM PRIOR TO FIRST MEAL.   **STABILIZES BLOOD GLUCOSE/REDUCES CRAVINGS**    2. CHROMIUM 400-500 MG WITH MEALS TWICE DAILY.    **FAT BURNER**    3. GREEN TEA EXTRACT ONE DAILY.   **FAT BURNER/SUPPRESSES YOUR APPETITE**    4. ALPHA LIPOIC ACID 250 MG TWICE DAILY.   **NATURAL ANTI-INFLAMMATORY SUPPLEMENT THAT IS AN ALTERNATIVE TO IBUPROFEN OR NAPROXEN**   TAKE DICYCLOMINE 10 MG TABLETS ONE OR TWO 30 MINUTES PRIOR MEALS UP TO THREE TIMES A DAY OR AT BEDTIME. IT MAY CAUSE DROWSINESS, DRY EYES/MOUTH, BLURRY VISION, OR DIFFICULTY URINATING.  FOLLOW UP IN 6 MOS.

## 2019-03-23 NOTE — Progress Notes (Signed)
Subjective:    Patient ID: Amy Russell, female    DOB: [birth year], 39 y.o.   MRN: [id number]  [name], MD  HPI Today this am had #4. Typically lives between [facility] #5-6. WILLING TO CHANGE HER DIET. SINCE OCT TAKING COLLAGEN, MAGNESIUM, AND ZINC. GOOD DAY: 3 AND BAD DAY" 1-/DAY. CAN HAVE UPPER ABDOMINAL PAIN THAT RADIATES AROUND TO HER BACK. HAS TROUBLE WITH WITH COLD HANDS AND FEELING VERY COLD. MAY NEED TO TAKE A SHOWER. SOMETIMES GETS NAUSEATED. TRYING TO GET ASTHMA REGULATED AND DOING INJECTION . IF SHE GETS IN A COUGHING SPELL SHE WILL GET NAUSEATED OR THROW UP. MOVING INJECTION UP TWICE A MONTH TO BETTER CONTROL ASTHMA.  PT DENIES FEVER, CHILLS, HEMATOCHEZIA, HEMATEMESIS, melena, diarrhea, CHEST PAIN, SHORTNESS OF BREATH, CHANGE IN BOWEL IN HABITS, constipation, OR problems swallowing, problems with sedation.  Past Medical History:  Diagnosis Date  . Allergy   . Asthma   . Chronic urticaria 09/07/2017  . GERD (gastroesophageal reflux disease)   . Recurrent upper respiratory infection (URI)    Past Surgical History:  Procedure Laterality Date  . CESAREAN SECTION  2011  . CHOLECYSTECTOMY     in college  . COLONOSCOPY N/A 10/12/2017   Dr. Darrick Penna: Normal terminal ileum, tortuous colon, hemorrhoids.  Biopsy showed proctocolitis possibly NSAID related.  Marland Kitchen MANDIBLE SURGERY     as a teenager    No Known Allergies  Current Outpatient Medications  Medication Sig    . acetaminophen (TYLENOL) 500 MG tablet Take 1,000 mg by mouth every 6 (six) hours as needed (for pain.).    Marland Kitchen albuterol (VENTOLIN HFA) 108 (90 Base) MCG/ACT inhaler Inhale 2 puffs into the lungs every 6 (six) hours as needed for wheezing.    . budesonide-formoterol (SYMBICORT) 80-4.5 MCG/ACT inhaler Inhale 2 puffs into the lungs 2 (two) times daily.    . cetirizine (ZYRTEC) 10 MG tablet Take 10 mg by mouth at bedtime.     . dicyclomine (BENTYL) 10 MG capsule TAKE 1 CAPSULE BEFORE MEALS AND 1 AT BEDTIME. 3X/DAY     . diphenhydrAMINE (BENADRYL) 25 mg capsule Take 25 mg by mouth every 6 (six) hours as needed (hives).     . famotidine (PEPCID) 40 MG tablet Take 1 tablet (40 mg total) by mouth daily.    . fexofenadine (ALLEGRA) 180 MG tablet Take 180 mg by mouth daily.    . fluticasone (FLONASE) 50 MCG/ACT nasal spray Place into both nostrils daily.    Marland Kitchen guaiFENesin (MUCINEX) 600 MG 12 hr tablet Take by mouth 2 (two) times daily as needed.    . hydrocortisone 2.5 % cream One bid to rash (Patient taking differently: as needed. One bid to rash)    . montelukast (SINGULAIR) 10 MG tablet Take 1 tablet (10 mg total) by mouth at bedtime.     Current Facility-Administered Medications  Medication Dose Route Frequency Provider Last Rate Last Admin  . omalizumab Geoffry Paradise) injection 300 mg  300 mg Subcutaneous Q28 days Alfonse Spruce, MD   300 mg at 03/08/19 1706   Review of Systems PER HPI OTHERWISE ALL SYSTEMS ARE NEGATIVE.    Objective:   Physical Exam Constitutional:      General: She is not in acute distress.    Appearance: Normal appearance.  HENT:     Mouth/Throat:     Comments: MASK IN PLACE Eyes:     General: No scleral icterus.    Pupils: Pupils are equal, round, and reactive  to light.  Cardiovascular:     Rate and Rhythm: Normal rate and regular rhythm.     Pulses: Normal pulses.     Heart sounds: Normal heart sounds.  Pulmonary:     Effort: Pulmonary effort is normal.     Breath sounds: Normal breath sounds.  Abdominal:     General: Bowel sounds are normal.     Palpations: Abdomen is soft.     Tenderness: There is no abdominal tenderness.  Musculoskeletal:     Cervical back: Normal range of motion.     Right lower leg: No edema.     Left lower leg: No edema.  Lymphadenopathy:     Cervical: No cervical adenopathy.  Skin:    General: Skin is warm and dry.  Neurological:     Mental Status: She is alert and oriented to person, place, and time.     Comments: NO  NEW FOCAL DEFICITS   Psychiatric:        Mood and Affect: Mood normal.     Comments: NORMAL AFFECT       Assessment & Plan:

## 2019-03-24 NOTE — Progress Notes (Signed)
Cc'ed to pcp °

## 2019-03-24 NOTE — Assessment & Plan Note (Signed)
SYMPTOMS NOT IDEALLY CONTROLLED. Can be due to meds, IBS, OR BILE SALT INDUCED DIARRHEA.  DRINK WATER TO KEEP YOUR URINE LIGHT YELLOW. FOLLOW A LOW FAT DIET. MEATS SHOULD BE BAKED, BROILED, OR BOILED. AVOID FRIED FOOD. AVOID FAST FOODS.  HANDOUT GIVEN. EAT TO LIVE AND THINK OF FOOD AS MEDICINE. 75% OF YOUR PLATE SHOULD BE FRUITS/VEGGIES. To have more energy, and to lose weight:      1. CONTINUE YOUR WEIGHT LOSS EFFORTS. I RECOMMEND YOU READ AND FOLLOW RECOMMENDATIONS BY DR. MARK HYMAN, "10-DAY DETOX DIET".   2. If you must eat bread, EAT EZEKIEL BREAD. IT IS IN THE FROZEN SECTION OF THE GROCERY STORE.   3. DRINK WATER WITH FRUIT OR CUCUMBER ADDED. YOUR URINE SHOULD BE LIGHT YELLOW. AVOID SODA, GATORADE, ENERGY DRINKS, OR DIET SODA.    4. AVOID HIGH FRUCTOSE CORN SYRUP AND CAFFEINE.    5. DO NOT chew SUGAR FREE GUM OR USE ARTIFICIAL SWEETENERS. IF NEEDED USE STEVIA AS A SWEETENER.   6. DO NOT EAT ENRICHED WHEAT FLOUR, PASTA, RICE, OR CEREAL.   7. ONLY EAT WILD CAUGHT SEAFOOD, GRASS FED BEEF OR CHICKEN, PORK FROM PASTURE RAISE PIGS, OR EGGS FROM PASTURE RAISED CHICKENS.   8. CONTINUE BEDTIME YOGA OR PRACTICE CHAIR YOGA FOR 15-30 MINS 3 OR 4 TIMES A WEEK AND PROGRESS TO HATHA YOGA OVER NEXT 6 MOS.   9. CONTINUE VITAMIN D3 AT LEAST 2000 IU DAILY.  ADDITIONAL SUPPLEMENTS TO DECREASE CRAVING AND SUPPRESS YOUR APPETITE:   1. CINNAMON 500 MG EVERY AM PRIOR TO FIRST MEAL.   **STABILIZES BLOOD GLUCOSE/REDUCES CRAVINGS**   2. CHROMIUM 400-500 MG WITH MEALS TWICE DAILY.    **FAT BURNER**   3. GREEN TEA EXTRACT ONE DAILY.   **FAT BURNER/SUPPRESSES YOUR APPETITE**   4. ALPHA LIPOIC ACID TWICE DAILY.   **NATURAL ANTI-INFLAMMATORY SUPPLEMENT THAT IS AN ALTERNATIVE TO IBUPROFEN OR NAPROXEN**  TAKE DICYCLOMINE 10 MG TABLETS ONE OR TWO 30 MINUTES PRIOR MEALS UP TO THREE TIMES A DAY OR AT BEDTIME. IT MAY CAUSE DROWSINESS, DRY EYES/MOUTH, BLURRY VISION, OR DIFFICULTY URINATING. FOLLOW UP IN 6 MOS.

## 2019-04-03 LAB — STREP PNEUMONIAE 23 SEROTYPES IGG
Pneumo Ab Type 1*: >13.1 ug/mL (ref >1.3)
Pneumo Ab Type 12 (12F)*: 3.9 ug/mL (ref >1.3)
Pneumo Ab Type 14*: >29.1 ug/mL (ref >1.3)
Pneumo Ab Type 17 (17F)*: 27.8 ug/mL (ref >1.3)
Pneumo Ab Type 19 (19F)*: 17 ug/mL (ref >1.3)
Pneumo Ab Type 2*: >28 ug/mL (ref >1.3)
Pneumo Ab Type 20*: 14.7 ug/mL (ref >1.3)
Pneumo Ab Type 22 (22F)*: 17.5 ug/mL (ref >1.3)
Pneumo Ab Type 23 (23F)*: 20.3 ug/mL (ref >1.3)
Pneumo Ab Type 26 (6B)*: 9.4 ug/mL (ref >1.3)
Pneumo Ab Type 3*: 29.9 ug/mL (ref >1.3)
Pneumo Ab Type 34 (10A)*: 13.9 ug/mL (ref >1.3)
Pneumo Ab Type 4*: 3.8 ug/mL (ref >1.3)
Pneumo Ab Type 43 (11A)*: >12.9 ug/mL (ref >1.3)
Pneumo Ab Type 5*: 73.4 ug/mL (ref >1.3)
Pneumo Ab Type 51 (7F)*: 3.5 ug/mL (ref >1.3)
Pneumo Ab Type 54 (15B)*: 30.6 ug/mL (ref >1.3)
Pneumo Ab Type 56 (18C)*: >16.7 ug/mL (ref >1.3)
Pneumo Ab Type 57 (19A)*: 11.1 ug/mL (ref >1.3)
Pneumo Ab Type 68 (9V)*: >21.2 ug/mL (ref >1.3)
Pneumo Ab Type 70 (33F)*: 2.6 ug/mL (ref >1.3)
Pneumo Ab Type 8*: 23.5 ug/mL (ref >1.3)
Pneumo Ab Type 9 (9N)*: 13 ug/mL (ref >1.3)

## 2019-04-04 ENCOUNTER — Encounter: Payer: Self-pay | Admitting: Allergy & Immunology

## 2019-04-04 ENCOUNTER — Encounter: Payer: Self-pay | Admitting: Family Medicine

## 2019-04-04 DIAGNOSIS — L501 Idiopathic urticaria: Secondary | ICD-10-CM | POA: Diagnosis not present

## 2019-04-05 ENCOUNTER — Other Ambulatory Visit: Payer: Self-pay

## 2019-04-05 ENCOUNTER — Ambulatory Visit (INDEPENDENT_AMBULATORY_CARE_PROVIDER_SITE_OTHER): Payer: BC Managed Care – PPO

## 2019-04-05 DIAGNOSIS — L501 Idiopathic urticaria: Secondary | ICD-10-CM | POA: Diagnosis not present

## 2019-04-05 DIAGNOSIS — J4540 Moderate persistent asthma, uncomplicated: Secondary | ICD-10-CM

## 2019-04-13 ENCOUNTER — Other Ambulatory Visit: Payer: Self-pay

## 2019-04-13 ENCOUNTER — Ambulatory Visit: Payer: BC Managed Care – PPO | Attending: Internal Medicine

## 2019-04-13 DIAGNOSIS — Z20822 Contact with and (suspected) exposure to covid-19: Secondary | ICD-10-CM

## 2019-04-15 LAB — NOVEL CORONAVIRUS, NAA

## 2019-04-17 ENCOUNTER — Telehealth: Payer: Self-pay | Admitting: *Deleted

## 2019-04-17 NOTE — Telephone Encounter (Signed)
Patient called for results ,still pending . 

## 2019-04-18 ENCOUNTER — Other Ambulatory Visit: Payer: Self-pay

## 2019-04-18 ENCOUNTER — Ambulatory Visit: Payer: BC Managed Care – PPO | Attending: Internal Medicine

## 2019-04-18 ENCOUNTER — Other Ambulatory Visit: Payer: Self-pay | Admitting: Allergy & Immunology

## 2019-04-18 ENCOUNTER — Ambulatory Visit (INDEPENDENT_AMBULATORY_CARE_PROVIDER_SITE_OTHER): Payer: BC Managed Care – PPO | Admitting: Family Medicine

## 2019-04-18 DIAGNOSIS — J019 Acute sinusitis, unspecified: Secondary | ICD-10-CM | POA: Diagnosis not present

## 2019-04-18 DIAGNOSIS — Z20822 Contact with and (suspected) exposure to covid-19: Secondary | ICD-10-CM

## 2019-04-18 MED ORDER — DOXYCYCLINE HYCLATE 100 MG PO TABS: 100.0000 mg | ORAL_TABLET | Freq: Two times a day (BID) | ORAL | 0 refills | Status: DC

## 2019-04-18 NOTE — Progress Notes (Signed)
   Subjective:    Patient ID: Amy Russell, female    DOB: 1980/10/23, 39 y.o.   MRN: [id number]  Sinusitis This is a new problem. Associated symptoms include congestion, coughing, sinus pressure and a sore throat. Pertinent negatives include no ear pain or shortness of breath. (Fever) Treatments tried: sore throat cold and cough, mucinex, delsym.  Significant head congestion drainage coughing sinus pressure she also has underlying history of asthma.  She had a recent Covid test which came back indeterminate family has had body aches headache cough congestion she is also had the same symptoms PMH benign Virtual Visit via Video Note  I connected with Amy Russell on 04/18/19 at 11:00 AM EST by a video enabled telemedicine application and verified that I am speaking with the correct person using two identifiers.  Location: Patient: home Provider: office   I discussed the limitations of evaluation and management by telemedicine and the availability of in person appointments. The patient expressed understanding and agreed to proceed.  History of Present Illness:    Observations/Objective:   Assessment and Plan:   Follow Up Instructions:    I discussed the assessment and treatment plan with the patient. The patient was provided an opportunity to ask questions and all were answered. The patient agreed with the plan and demonstrated an understanding of the instructions.   The patient was advised to call back or seek an in-person evaluation if the symptoms worsen or if the condition fails to improve as anticipated.  I provided 17 minutes of non-face-to-face time during this encounter.      Review of Systems  Constitutional: Negative for activity change and fever.  HENT: Positive for congestion, rhinorrhea, sinus pressure and sore throat. Negative for ear pain.   Eyes: Negative for discharge.  Respiratory: Positive for cough. Negative for shortness of breath and wheezing.     Cardiovascular: Negative for chest pain.       Objective:   Physical Exam Patient had virtual visit Appears to be in no distress Atraumatic Neuro able to relate and oriented No apparent resp distress Color normal        Assessment & Plan:  Viral syndrome Secondary rhinosinusitis Antibiotic prescribed warning signs discussed follow-up ongoing trouble Call us if any problems or issues. Covid testing recommended

## 2019-04-19 ENCOUNTER — Ambulatory Visit: Payer: Self-pay

## 2019-04-19 LAB — NOVEL CORONAVIRUS, NAA: SARS-CoV-2, NAA: NOT DETECTED

## 2019-04-20 ENCOUNTER — Other Ambulatory Visit: Payer: Self-pay | Admitting: Family Medicine

## 2019-04-20 ENCOUNTER — Encounter: Payer: Self-pay | Admitting: Family Medicine

## 2019-04-20 MED ORDER — PREDNISONE 20 MG PO TABS: ORAL_TABLET | ORAL | 0 refills | Status: DC

## 2019-04-20 MED ORDER — CEFDINIR 300 MG PO CAPS: 300.0000 mg | ORAL_CAPSULE | Freq: Two times a day (BID) | ORAL | 0 refills | Status: DC

## 2019-04-20 MED ORDER — ONDANSETRON HCL 8 MG PO TABS: 8.0000 mg | ORAL_TABLET | Freq: Three times a day (TID) | ORAL | 1 refills | Status: DC | PRN

## 2019-04-20 NOTE — Telephone Encounter (Signed)
Hi Amy Russell I did send in some prednisone that you can utilize over the course of the next 8 days I also sent in some Zofran that you could utilize up to 3 times a day as needed for the nausea Should you have difficulty with breathing or find yourself feeling worse please call

## 2019-05-03 ENCOUNTER — Ambulatory Visit: Payer: Self-pay

## 2019-05-09 DIAGNOSIS — L501 Idiopathic urticaria: Secondary | ICD-10-CM | POA: Diagnosis not present

## 2019-05-10 ENCOUNTER — Ambulatory Visit (INDEPENDENT_AMBULATORY_CARE_PROVIDER_SITE_OTHER): Payer: BC Managed Care – PPO

## 2019-05-10 ENCOUNTER — Other Ambulatory Visit: Payer: Self-pay

## 2019-05-10 DIAGNOSIS — L501 Idiopathic urticaria: Secondary | ICD-10-CM

## 2019-05-10 DIAGNOSIS — J4540 Moderate persistent asthma, uncomplicated: Secondary | ICD-10-CM

## 2019-05-23 DIAGNOSIS — L501 Idiopathic urticaria: Secondary | ICD-10-CM | POA: Diagnosis not present

## 2019-05-24 ENCOUNTER — Ambulatory Visit (INDEPENDENT_AMBULATORY_CARE_PROVIDER_SITE_OTHER): Payer: BC Managed Care – PPO

## 2019-05-24 ENCOUNTER — Other Ambulatory Visit: Payer: Self-pay

## 2019-05-24 ENCOUNTER — Encounter: Payer: Self-pay | Admitting: Family Medicine

## 2019-05-24 DIAGNOSIS — J4540 Moderate persistent asthma, uncomplicated: Secondary | ICD-10-CM

## 2019-05-24 DIAGNOSIS — L501 Idiopathic urticaria: Secondary | ICD-10-CM | POA: Diagnosis not present

## 2019-05-27 ENCOUNTER — Other Ambulatory Visit: Payer: Self-pay | Admitting: Allergy & Immunology

## 2019-06-06 DIAGNOSIS — L501 Idiopathic urticaria: Secondary | ICD-10-CM | POA: Diagnosis not present

## 2019-06-07 ENCOUNTER — Ambulatory Visit (INDEPENDENT_AMBULATORY_CARE_PROVIDER_SITE_OTHER): Payer: BC Managed Care – PPO

## 2019-06-07 ENCOUNTER — Ambulatory Visit: Payer: Self-pay

## 2019-06-07 ENCOUNTER — Other Ambulatory Visit: Payer: Self-pay

## 2019-06-07 DIAGNOSIS — L501 Idiopathic urticaria: Secondary | ICD-10-CM

## 2019-06-07 DIAGNOSIS — J4540 Moderate persistent asthma, uncomplicated: Secondary | ICD-10-CM

## 2019-06-14 ENCOUNTER — Ambulatory Visit: Payer: Self-pay

## 2019-06-20 DIAGNOSIS — L501 Idiopathic urticaria: Secondary | ICD-10-CM | POA: Diagnosis not present

## 2019-06-21 ENCOUNTER — Ambulatory Visit (INDEPENDENT_AMBULATORY_CARE_PROVIDER_SITE_OTHER): Payer: BC Managed Care – PPO

## 2019-06-21 ENCOUNTER — Other Ambulatory Visit: Payer: Self-pay

## 2019-06-21 DIAGNOSIS — J4540 Moderate persistent asthma, uncomplicated: Secondary | ICD-10-CM

## 2019-06-21 DIAGNOSIS — L501 Idiopathic urticaria: Secondary | ICD-10-CM

## 2019-07-04 DIAGNOSIS — L501 Idiopathic urticaria: Secondary | ICD-10-CM | POA: Diagnosis not present

## 2019-07-05 ENCOUNTER — Ambulatory Visit (INDEPENDENT_AMBULATORY_CARE_PROVIDER_SITE_OTHER): Payer: BC Managed Care – PPO

## 2019-07-05 DIAGNOSIS — L501 Idiopathic urticaria: Secondary | ICD-10-CM | POA: Diagnosis not present

## 2019-07-14 ENCOUNTER — Other Ambulatory Visit: Payer: Self-pay

## 2019-07-14 ENCOUNTER — Ambulatory Visit: Payer: BC Managed Care – PPO | Admitting: Allergy & Immunology

## 2019-07-14 ENCOUNTER — Encounter: Payer: Self-pay | Admitting: Allergy & Immunology

## 2019-07-14 VITALS — BP 130/88 | HR 78 | Temp 97.4°F | Resp 18

## 2019-07-14 DIAGNOSIS — J3089 Other allergic rhinitis: Secondary | ICD-10-CM | POA: Diagnosis not present

## 2019-07-14 DIAGNOSIS — B999 Unspecified infectious disease: Secondary | ICD-10-CM

## 2019-07-14 DIAGNOSIS — J4540 Moderate persistent asthma, uncomplicated: Secondary | ICD-10-CM | POA: Diagnosis not present

## 2019-07-14 DIAGNOSIS — J302 Other seasonal allergic rhinitis: Secondary | ICD-10-CM

## 2019-07-14 DIAGNOSIS — L508 Other urticaria: Secondary | ICD-10-CM

## 2019-07-14 MED ORDER — MONTELUKAST SODIUM 10 MG PO TABS: 10.0000 mg | ORAL_TABLET | Freq: Every day | ORAL | 5 refills | Status: DC

## 2019-07-14 NOTE — Patient Instructions (Addendum)
1. Chronic urticaria - We will continue with Xolair every two weeks - We could consider changing to Dupixent if needed.  - In the meantime, continue suppressive dosing of antihistamines:   - Morning: Allegra (fexofenadine) 1-2 tablets    - Evening: Zyrtec (cetirizine) 20mg  (two tablets) + Singulair (montelukast) 10mg  + Pepcid 40mg  daily  - You can change this dosing at home, decreasing the dose as needed or increasing the dosing as needed.   2. Seasonal and perennial allergic rhinitis (trees, weeds, indoor molds, dust mites and cockroach) - Continue with: Zyrtec (cetirizine) 10mg  tablet once daily, Singulair (montelukast) 10mg  daily and Flonase (fluticasone) one spray per nostril daily  - Spray the Flonase towards the sides of the nose. - You can use an extra dose of the antihistamine, if needed, for breakthrough symptoms.  - Consider nasal saline rinses 1-2 times daily to remove allergens from the nasal cavities as well as help with mucous clearance (this is especially helpful to do before the nasal sprays are given) - Consider allergy shots as a means of long-term control.  3. Moderate persistent asthma - Lung testing looked great today. - Spacer sample and demonstration provided. - Daily controller medication(s): Symbicort 80/4.72mcg two puffs twice daily with spacer + Singulair 10mg  + Xolair  - Prior to physical activity: albuterol 2 puffs 10-15 minutes before physical activity. - Rescue medications: albuterol 4 puffs every 4-6 hours as needed - Asthma control goals:  * Full participation in all desired activities (may need albuterol before activity) * Albuterol use two time or less a week on average (not counting use with activity) * Cough interfering with sleep two time or less a month * Oral steroids no more than once a year * No hospitalizations  4. Return in about 6 months (around 01/14/2020). This can be an in-person, a virtual Webex or a telephone follow up visit.   Please  inform of any Emergency Department visits, hospitalizations, or changes in symptoms. Call before going to the ED for breathing or allergy symptoms since we might be able to fit you in for a sick visit. Feel free to contact anytime with any questions, problems, or concerns.  It was a pleasure to see you again today! CONGRATS ON THE UPCOMING WEDDING!   Websites that have reliable patient information: 1. American Academy of Asthma, Allergy, and Immunology: www.aaaai.org 2. Food Allergy Research and Education (FARE): foodallergy.org 3. Mothers of Asthmatics: http://www.asthmacommunitynetwork.org 4. American College of Allergy, Asthma, and Immunology: www.acaai.org   COVID-19 Vaccine Information can be found at: 4m For questions related to vaccine distribution or appointments, please email [email or web address] or call [phone].     "Like" 13/09/2019 on Facebook and Instagram for our latest updates!       HAPPY SPRING!  Make sure you are registered to vote! If you have moved or changed any of your contact information, you will need to get this updated before voting!  In some cases, you MAY be able to register to vote online: [place]

## 2019-07-14 NOTE — Progress Notes (Signed)
FOLLOW UP  Date of Service/Encounter:  07/14/19   Assessment:   Chronic urticaria - improved on Xolair but with breakthrough urticaria closer to each Xolair injection  Seasonal and perennial allergic rhinitis(trees, weeds, indoor molds, dust mites and cockroach)  Disabled status  Moderate persistent asthma, uncomplicated - with improved control due to [employer] social situation with her ex husband   Plan/Recommendations:   1. Chronic urticaria - We will continue with Xolair every two weeks - We could consider changing to Dupixent if needed.  - In the meantime, continue suppressive dosing of antihistamines:   - Morning: Allegra (fexofenadine) 1-2 tablets    - Evening: Zyrtec (cetirizine) 20mg  (two tablets) + Singulair (montelukast) 10mg  + Pepcid 40mg  daily  - You can change this dosing at home, decreasing the dose as needed or increasing the dosing as needed.   2. Seasonal and perennial allergic rhinitis (trees, weeds, indoor molds, dust mites and cockroach) - Continue with: Zyrtec (cetirizine) 10mg  tablet once daily, Singulair (montelukast) 10mg  daily and Flonase (fluticasone) one spray per nostril daily  - Spray the Flonase towards the sides of the nose. - You can use an extra dose of the antihistamine, if needed, for breakthrough symptoms.  - Consider nasal saline rinses 1-2 times daily to remove allergens from the nasal cavities as well as help with mucous clearance (this is especially helpful to do before the nasal sprays are given) - Consider allergy shots as a means of long-term control.  3. Moderate persistent asthma - Lung testing looked great today. - Spacer sample and demonstration provided. - Daily controller medication(s): Symbicort 80/4.79mcg two puffs twice daily with spacer + Singulair 10mg  + Xolair  - Prior to physical activity: albuterol 2 puffs 10-15 minutes before physical activity. - Rescue medications: albuterol 4 puffs every 4-6 hours as  needed - Asthma control goals:  * Full participation in all desired activities (may need albuterol before activity) * Albuterol use two time or less a week on average (not counting use with activity) * Cough interfering with sleep two time or less a month * Oral steroids no more than once a year * No hospitalizations  4. Return in about 6 months (around 01/14/2020). This can be an in-person, a virtual Webex or a telephone follow up visit.  Subjective:   Amy Russell is a 39 y.o. female presenting today for follow up of  Chief Complaint  Patient presents with  . Asthma    Using albuterol on an average of twice weekly due to SOB    has a history of the following: Patient Active Problem List   Diagnosis Date Noted  . Arthralgia 12/09/2018  . Uncontrolled moderate persistent asthma 12/08/2018  . Diarrhea   . Abdominal pain 09/24/2017  . GERD (gastroesophageal reflux disease) 09/24/2017  . Rectal bleeding 09/24/2017  . Chronic urticaria 09/07/2017  . Reactive airway disease 11/25/2015    History obtained from: chart review and patient.  Amy Russell is a 39 y.o. female presenting for a follow up visit. she was last seen in January 2021.  At that time, we worked on getting Xolair approved for every 2 weeks.  We continued with Allegra 2 tablets in the morning and Zyrtec, Singulair, and Pepcid at night.  Asthma was controlled with Symbicort 80/4.5 mcg 2 puffs twice daily as well as Xolair.  Since last visit, she has mostly done well. She is actually trying to get married over the summer and he is probably  moving to [place]. He works for [employer]  and helps with building them. She thinks that she might have to do something part time or might not even have to work at all.   Asthma/Respiratory Symptom History: She remains on her controller medication which is helping quite a bit. She is not using her rescue inhaler at all. [name] asthma has been well controlled. She  has not required rescue medication, experienced nocturnal awakenings due to lower respiratory symptoms, nor have activities of daily living been limited. She has required no Emergency Department or Urgent Care visits for her asthma. She has required zero courses of systemic steroids for asthma exacerbations since the last visit. ACT score today is 21, indicating excellent asthma symptom control.   Allergic Rhinitis Symptom History: She remains on cetirizine as well as montelukast.. She is not using a nose sprays on a regular basis. She has not needed any antibiotics at all since the last visit.   Urticaria Symptom History: She has done well on the Xolair every two weeks with regards to her hives. She does flare when she gets stressed still. Overall she sees a difference being on the Xolair. It never went away before but now she is doing fine with the Xolair.    She is still having quite a bit of stress. She ex-husband is "psycho" and apparently only sees the kids every two weeks for one hour in her carport. She is getting married over the summer and will likely be moving to [place] to live with her new husband. She is hoping to keep her location in [place] secret from her ex-husband.   Otherwise, there have been no changes to her past medical history, surgical history, family history, or social history.    Review of Systems  Constitutional: Negative.  Negative for chills, fever, malaise/fatigue and weight loss.  HENT: Negative.  Negative for congestion, ear discharge, ear pain and sore throat.   Eyes: Negative for pain, discharge and redness.  Respiratory: Negative for cough, sputum production, shortness of breath and wheezing.   Cardiovascular: Negative.  Negative for chest pain and palpitations.  Gastrointestinal: Negative for abdominal pain, constipation, diarrhea, heartburn, nausea and vomiting.  Skin: Negative.  Negative for itching and rash.  Neurological: Negative for dizziness and  headaches.  Endo/Heme/Allergies: Negative for environmental allergies. Does not bruise/bleed easily.       Objective:   Blood pressure 130/88, pulse 78, temperature (!) 97.4 F (36.3 C), temperature source Temporal, resp. rate 18, SpO2 96 %. There is no height or weight on file to calculate BMI.   Physical Exam:  Physical Exam  Constitutional: She appears well-developed.  Very pleasant female.   HENT:  Head: Normocephalic and atraumatic.  Right Ear: Tympanic membrane, external ear and ear canal normal.  Left Ear: Tympanic membrane, external ear and ear canal normal.  Nose: Mucosal edema and rhinorrhea present. No nasal deformity or septal deviation. No epistaxis. Right sinus exhibits no maxillary sinus tenderness and no frontal sinus tenderness. Left sinus exhibits no maxillary sinus tenderness and no frontal sinus tenderness.  Mouth/Throat: Uvula is midline and oropharynx is clear and moist. Mucous membranes are not pale and not dry.  There is some mild cobblestoning present in the posterior oropharynx.   Eyes: Pupils are equal, round, and reactive to light. Conjunctivae and EOM are normal. Right eye exhibits no chemosis and no discharge. Left eye exhibits no chemosis and no discharge. Right conjunctiva is not injected. Left conjunctiva is not injected.  Cardiovascular:  Normal rate, regular rhythm and normal heart sounds.  Respiratory: Effort normal and breath sounds normal. No accessory muscle usage. No tachypnea. No respiratory distress. She has no wheezes. She has no rhonchi. She has no rales. She exhibits no tenderness.  Lymphadenopathy:    She has no cervical adenopathy.  Neurological: She is alert.  Skin: No abrasion, no petechiae and no rash noted. Rash is not papular, not vesicular and not urticarial. No erythema. No pallor.  No eczematous or urticarial lesions noted.   Psychiatric: She has a normal mood and affect.     Diagnostic studies:    Spirometry: results normal  (FEV1: 2.94/90%, FVC: 3.59/89%, FEV1/FVC: 82%).    Spirometry consistent with normal pattern.   Allergy Studies: none      Salvatore Marvel, MD  [facility]

## 2019-07-17 ENCOUNTER — Encounter: Payer: Self-pay | Admitting: Allergy & Immunology

## 2019-07-17 ENCOUNTER — Other Ambulatory Visit: Payer: Self-pay | Admitting: *Deleted

## 2019-07-17 MED ORDER — ALBUTEROL SULFATE HFA 108 (90 BASE) MCG/ACT IN AERS: 2.0000 | INHALATION_SPRAY | RESPIRATORY_TRACT | 2 refills | Status: DC | PRN

## 2019-07-17 MED ORDER — SYMBICORT 80-4.5 MCG/ACT IN AERO: 2.0000 | INHALATION_SPRAY | Freq: Two times a day (BID) | RESPIRATORY_TRACT | 5 refills | Status: DC

## 2019-07-17 MED ORDER — BUDESONIDE-FORMOTEROL FUMARATE 80-4.5 MCG/ACT IN AERO: 2.0000 | INHALATION_SPRAY | Freq: Two times a day (BID) | RESPIRATORY_TRACT | 5 refills | Status: DC

## 2019-07-17 MED ORDER — FAMOTIDINE 40 MG PO TABS: 40.0000 mg | ORAL_TABLET | Freq: Every day | ORAL | 5 refills | Status: DC

## 2019-07-17 MED ORDER — FLUTICASONE PROPIONATE 50 MCG/ACT NA SUSP: 1.0000 | Freq: Every day | NASAL | 5 refills | Status: DC

## 2019-07-18 DIAGNOSIS — L501 Idiopathic urticaria: Secondary | ICD-10-CM | POA: Diagnosis not present

## 2019-07-19 ENCOUNTER — Ambulatory Visit (INDEPENDENT_AMBULATORY_CARE_PROVIDER_SITE_OTHER): Payer: BC Managed Care – PPO

## 2019-07-19 ENCOUNTER — Other Ambulatory Visit: Payer: Self-pay

## 2019-07-19 DIAGNOSIS — J4540 Moderate persistent asthma, uncomplicated: Secondary | ICD-10-CM

## 2019-07-19 DIAGNOSIS — L501 Idiopathic urticaria: Secondary | ICD-10-CM | POA: Diagnosis not present

## 2019-08-01 DIAGNOSIS — L501 Idiopathic urticaria: Secondary | ICD-10-CM

## 2019-08-02 ENCOUNTER — Ambulatory Visit (INDEPENDENT_AMBULATORY_CARE_PROVIDER_SITE_OTHER): Payer: BC Managed Care – PPO

## 2019-08-02 DIAGNOSIS — L501 Idiopathic urticaria: Secondary | ICD-10-CM | POA: Diagnosis not present

## 2019-08-16 ENCOUNTER — Ambulatory Visit: Payer: Self-pay

## 2019-08-24 DIAGNOSIS — L501 Idiopathic urticaria: Secondary | ICD-10-CM | POA: Diagnosis not present

## 2019-08-25 ENCOUNTER — Ambulatory Visit (INDEPENDENT_AMBULATORY_CARE_PROVIDER_SITE_OTHER): Payer: BC Managed Care – PPO

## 2019-08-25 ENCOUNTER — Other Ambulatory Visit: Payer: Self-pay

## 2019-08-25 DIAGNOSIS — L501 Idiopathic urticaria: Secondary | ICD-10-CM | POA: Diagnosis not present

## 2019-08-25 DIAGNOSIS — J4540 Moderate persistent asthma, uncomplicated: Secondary | ICD-10-CM

## 2019-09-12 DIAGNOSIS — L501 Idiopathic urticaria: Secondary | ICD-10-CM

## 2019-09-13 ENCOUNTER — Other Ambulatory Visit: Payer: Self-pay

## 2019-09-13 ENCOUNTER — Ambulatory Visit (INDEPENDENT_AMBULATORY_CARE_PROVIDER_SITE_OTHER): Payer: BC Managed Care – PPO

## 2019-09-13 DIAGNOSIS — L501 Idiopathic urticaria: Secondary | ICD-10-CM | POA: Diagnosis not present

## 2019-09-22 ENCOUNTER — Ambulatory Visit: Payer: BC Managed Care – PPO | Admitting: Gastroenterology

## 2019-09-27 ENCOUNTER — Ambulatory Visit: Payer: Self-pay

## 2019-09-28 DIAGNOSIS — L501 Idiopathic urticaria: Secondary | ICD-10-CM | POA: Diagnosis not present

## 2019-09-29 ENCOUNTER — Other Ambulatory Visit: Payer: Self-pay

## 2019-09-29 ENCOUNTER — Ambulatory Visit (INDEPENDENT_AMBULATORY_CARE_PROVIDER_SITE_OTHER): Payer: BC Managed Care – PPO

## 2019-09-29 DIAGNOSIS — L501 Idiopathic urticaria: Secondary | ICD-10-CM | POA: Diagnosis not present

## 2019-10-09 ENCOUNTER — Telehealth: Payer: Self-pay | Admitting: Family Medicine

## 2019-10-09 ENCOUNTER — Telehealth (INDEPENDENT_AMBULATORY_CARE_PROVIDER_SITE_OTHER): Payer: BC Managed Care – PPO | Admitting: Family Medicine

## 2019-10-09 ENCOUNTER — Other Ambulatory Visit: Payer: Self-pay

## 2019-10-09 DIAGNOSIS — R05 Cough: Secondary | ICD-10-CM

## 2019-10-09 DIAGNOSIS — Z77120 Contact with and (suspected) exposure to mold (toxic): Secondary | ICD-10-CM | POA: Diagnosis not present

## 2019-10-09 MED ORDER — HYDROCODONE-HOMATROPINE 5-1.5 MG/5ML PO SYRP: ORAL_SOLUTION | ORAL | 0 refills | Status: DC

## 2019-10-09 MED ORDER — PREDNISONE 20 MG PO TABS
ORAL_TABLET | ORAL | 0 refills | Status: DC
Start: 2019-10-09 — End: 2019-10-12

## 2019-10-09 MED ORDER — BUDESONIDE-FORMOTEROL FUMARATE 160-4.5 MCG/ACT IN AERO
INHALATION_SPRAY | RESPIRATORY_TRACT | 0 refills | Status: DC
Start: 2019-10-09 — End: 2023-04-21

## 2019-10-09 NOTE — Telephone Encounter (Signed)
Ms. Amy Russell are scheduled for a virtual visit with your provider today.    Just as we do with appointments in the office, we must obtain your consent to participate.  Your consent will be active for this visit and any virtual visit you may have with one of our providers in the next 365 days.    If you have a MyChart account, I can also send a copy of this consent to you electronically.  All virtual visits are billed to your insurance company just like a traditional visit in the office.  As this is a virtual visit, video technology does not allow for your provider to perform a traditional examination.  This may limit your provider's ability to fully assess your condition.  If your provider identifies any concerns that need to be evaluated in person or the need to arrange testing such as labs, EKG, etc, we will make arrangements to do so.    Although advances in technology are sophisticated, we cannot ensure that it will always work on either your end or our end.  If the connection with a video visit is poor, we may have to switch to a telephone visit.  With either a video or telephone visit, we are not always able to ensure that we have a secure connection.   I need to obtain your verbal consent now.   Are you willing to proceed with your visit today?   Amy Russell has provided verbal consent on 10/09/2019 for a virtual visit (video or telephone).   Marlowe Shores, LPN 10/11/6312  9:70 AM

## 2019-10-09 NOTE — Patient Instructions (Signed)
Increase Symbicort dose. Start prednisone as instructed. Use Hycodan as needed for cough as instructed.

## 2019-10-09 NOTE — Progress Notes (Signed)
   Subjective:    Patient ID: Amy Russell, female    DOB: [birth year], 39 y.o.   MRN: [id number]  Cough This is a new problem. Cough characteristics: sometime will vomit due to coughing. Associated symptoms comments: Cough, blood clots while blowing nose at times, some shortness of breath due to coughing. Pt states she is allergic to mold and has been moving/cleaning. Treatments tried: Delsum, inhaler, allergy med, benadryl  The treatment provided mild relief.   Virtual Visit via Telephone Note  I connected with Amy Russell on 10/09/19 at 11:20 AM EDT by telephone and verified that I am speaking with the correct person using two identifiers.  Location: Patient: home Provider: office   I discussed the limitations, risks, security and privacy concerns of performing an evaluation and management service by telephone and the availability of in person appointments. I also discussed with the patient that there may be a patient responsible charge related to this service. The patient expressed understanding and agreed to proceed.   History of Present Illness: Has been moving on Sat/Sun and had exposure to mold. Since Sat has been coughing and wheezing and chest tightness. Denies fever or exposure to Covid-19.    Observations/Objective: Patient noted to cough every few words during interview.   Assessment and Plan: 1. Contact with and (suspected) exposure to mold (toxic) Exposed to mold while moving on Sat and Sun. Increase Symbicort to 164mcg-4.5, start predisone 20mg  2 tablets BID x 5 days, hycodan 1 tsp as needed every 6 hours.   Follow Up Instructions: Use hycodan only when at home (do not drive vehicle). If your symptoms have not improved in next 2-3 days please follow-up in the office so we can assess you in person. If you start to run a fever or shortness of breath seek medical attention immediately.    I discussed the assessment and treatment plan with the patient. The patient  was provided an opportunity to ask questions and all were answered. The patient agreed with the plan and demonstrated an understanding of the instructions.   The patient was advised to call back or seek an in-person evaluation if the symptoms worsen or if the condition fails to improve as anticipated.  I provided 20 minutes of non-face-to-face time during this encounter.        Review of Systems  Respiratory: Positive for cough.    Exposure to mold on Sat and Sunday and cannot stop coughing.     Objective:   Physical Exam  Unable to assess. Could hear patient coughing every few words during interview.      Assessment & Plan:  1. Contact with and (suspected) exposure to mold (toxic) Symbicort dose increased to 129mcg/4.5 2 inhalations BID. Prednisone 20mg  2 tablets BID. Hycodan 1 tsp as needed Q 6 hours.  Follow-up in 2-3 days if not improving.

## 2019-10-10 ENCOUNTER — Ambulatory Visit: Payer: BC Managed Care – PPO | Admitting: Allergy & Immunology

## 2019-10-11 ENCOUNTER — Telehealth: Payer: Self-pay

## 2019-10-11 NOTE — Telephone Encounter (Signed)
Patient called wanting know if she could come in for a visit. Patient states she's had a cough since Friday and thinks it's due to mold exposure (cleaning out house). Patient did a virtual visit with her PCP Monday 8/2 and had her Symbicort increased to 160 and was prescribed Prednisone for 5 days and is not feeling any better. Patient complaining of cough, chest tightness, and headache (from cough per patient).

## 2019-10-12 ENCOUNTER — Ambulatory Visit (INDEPENDENT_AMBULATORY_CARE_PROVIDER_SITE_OTHER): Payer: BC Managed Care – PPO | Admitting: Family Medicine

## 2019-10-12 ENCOUNTER — Other Ambulatory Visit: Payer: Self-pay

## 2019-10-12 DIAGNOSIS — J4520 Mild intermittent asthma, uncomplicated: Secondary | ICD-10-CM | POA: Diagnosis not present

## 2019-10-12 DIAGNOSIS — J019 Acute sinusitis, unspecified: Secondary | ICD-10-CM

## 2019-10-12 DIAGNOSIS — R059 Cough, unspecified: Secondary | ICD-10-CM

## 2019-10-12 DIAGNOSIS — B370 Candidal stomatitis: Secondary | ICD-10-CM

## 2019-10-12 DIAGNOSIS — R05 Cough: Secondary | ICD-10-CM

## 2019-10-12 MED ORDER — FLUCONAZOLE 200 MG PO TABS: 200.0000 mg | ORAL_TABLET | Freq: Every day | ORAL | 0 refills | Status: DC

## 2019-10-12 MED ORDER — HYDROCODONE-HOMATROPINE 5-1.5 MG/5ML PO SYRP: ORAL_SOLUTION | ORAL | 0 refills | Status: DC

## 2019-10-12 MED ORDER — CEFDINIR 300 MG PO CAPS: 300.0000 mg | ORAL_CAPSULE | Freq: Two times a day (BID) | ORAL | 0 refills | Status: DC

## 2019-10-12 MED ORDER — PREDNISONE 20 MG PO TABS: ORAL_TABLET | ORAL | 0 refills | Status: DC

## 2019-10-12 NOTE — Telephone Encounter (Signed)
I spoke with Amy Russell and she feels really bad. Patient went to see PCP today and was given a round of antibiotics and steroids. Patient is going to give the meds a few days to work and see how she feels. She states she will give [place] a call if she doesn't get better.

## 2019-10-12 NOTE — Telephone Encounter (Signed)
Noted.  I am glad she was able to get some help.  Malachi Bonds, MD [facility]

## 2019-10-12 NOTE — Telephone Encounter (Signed)
Can we squeeze her in tmr anytime?? Sending to [place] to make magic happen.  Malachi Bonds, MD [facility]

## 2019-10-12 NOTE — Progress Notes (Signed)
   Subjective:    Patient ID: Amy Russell, female    DOB: 11/20/1980, 39 y.o.   MRN: [id number]  Patient comes in today with complaints of worsening cough since being seen earlier in the week. Patient believes she was exposed to mold which she is allergic to. Having a lot of coughing congestion chest tightness bringing up discolored phlegm denies high fever chills denies exposure to Covid has had a vaccine has a history of asthma allergy related issues    Objective:   Physical Exam Frequent coughing type coughing no crackles no respiratory distress HEENT benign       Assessment & Plan:  1. Cough We will send Covid test she has been vaccinated likelihood is low O2 saturation 97% warning signs discussed - Novel Coronavirus, NAA ([facility])  2. Mild intermittent reactive airway disease without complication Prednisone taper over the next 9 days continue Symbicort rinse after use Warning signs discussed rescue albuterol inhaler on a regular basis  3. Acute rhinosinusitis Antibiotics prescribed Patient does have early crackles in the left base does not appear toxic no need for x-ray  Diflucan sent in prescription placed on file use if the rash recurs  Hycodan for sparing use caution drowsiness warning signs discussed

## 2019-10-13 ENCOUNTER — Ambulatory Visit: Payer: Self-pay

## 2019-10-13 LAB — SARS-COV-2, NAA 2 DAY TAT

## 2019-10-13 LAB — NOVEL CORONAVIRUS, NAA: SARS-CoV-2, NAA: NOT DETECTED

## 2019-10-13 LAB — SPECIMEN STATUS REPORT

## 2019-10-18 ENCOUNTER — Other Ambulatory Visit: Payer: Self-pay

## 2019-10-18 ENCOUNTER — Ambulatory Visit: Payer: Self-pay

## 2019-10-24 DIAGNOSIS — L501 Idiopathic urticaria: Secondary | ICD-10-CM | POA: Diagnosis not present

## 2019-10-25 ENCOUNTER — Other Ambulatory Visit: Payer: Self-pay

## 2019-10-25 ENCOUNTER — Ambulatory Visit (INDEPENDENT_AMBULATORY_CARE_PROVIDER_SITE_OTHER): Payer: BC Managed Care – PPO

## 2019-10-25 ENCOUNTER — Encounter: Payer: Self-pay | Admitting: Family Medicine

## 2019-10-25 ENCOUNTER — Other Ambulatory Visit: Payer: Self-pay | Admitting: *Deleted

## 2019-10-25 DIAGNOSIS — L501 Idiopathic urticaria: Secondary | ICD-10-CM | POA: Diagnosis not present

## 2019-10-25 MED ORDER — AMOXICILLIN-POT CLAVULANATE 875-125 MG PO TABS
1.0000 | ORAL_TABLET | Freq: Two times a day (BID) | ORAL | 0 refills | Status: DC
Start: 2019-10-25 — End: 2020-02-21

## 2019-10-25 NOTE — Telephone Encounter (Signed)
Nurses With the patient having ongoing symptoms as discussed in her message please initiate the following  I would recommend 10 additional days of antibiotics.  Augmentin 875 mg 1 taken twice daily for 10 days for the purpose of helping the sinuses.  May use nasal decongestant on a as needed basis for no more than 4 days in a row to help with sinus pressure.  Continue Symbicort daily and albuterol on a as needed basis as a rescue inhaler  I would encourage that she keeps Dr. Dellis Anes in the loop  Doing the injection would be wise but if they do not allow her to do this while on antibiotics she may have to put this off for 10 days  I do think it is a good idea for her daughter to do the Covid vaccine.  As for being around others it would be wise to not be around others over the course of the next 5 to 6 days until convinced things are turning the corner.  Thanks-Dr. Lorin Picket

## 2019-11-03 ENCOUNTER — Encounter: Payer: Self-pay | Admitting: Family Medicine

## 2019-11-08 ENCOUNTER — Ambulatory Visit (INDEPENDENT_AMBULATORY_CARE_PROVIDER_SITE_OTHER): Payer: BC Managed Care – PPO

## 2019-11-08 DIAGNOSIS — L501 Idiopathic urticaria: Secondary | ICD-10-CM | POA: Diagnosis not present

## 2019-11-09 IMAGING — CT CT ABD-PELV W/ CM
2 of 4 series · 16 of 46 positions shown, 18 images · IV contrast (Isovue)
Comparison: 07/13/2003

CLINICAL DATA: Diffuse abdominal pain for 1 month with nausea,
vomiting and diarrhea.

EXAM:
CT ABDOMEN AND PELVIS WITH CONTRAST
TECHNIQUE: Multidetector CT imaging of the abdomen and pelvis was performed
using the standard protocol following bolus administration of
intravenous contrast.
CONTRAST:  100mL [id number] IOPAMIDOL (U926FA-DMM) INJECTION 61%

[Series 2: axial st · axial · 0.86mm/px · z∈[-541,-51]mm · 13 of 108 slices shown, 15 images]
[im 5/108  soft-tissue]
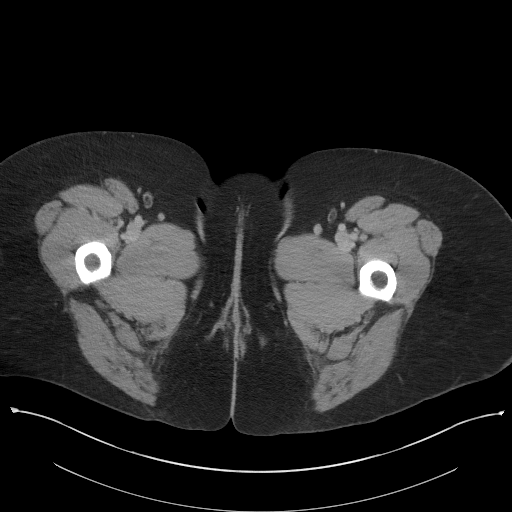
[im 5/108  bone]
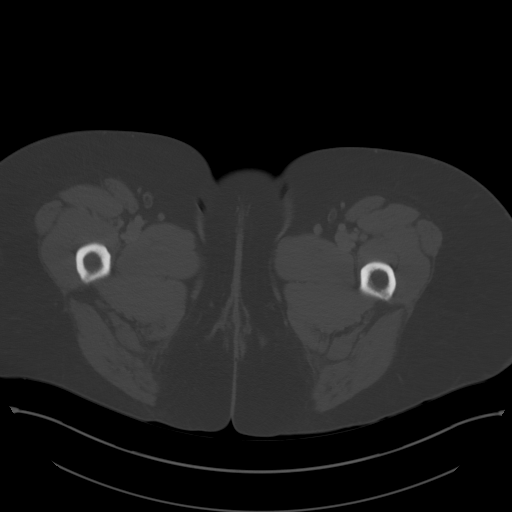
[im 14/108  soft-tissue]
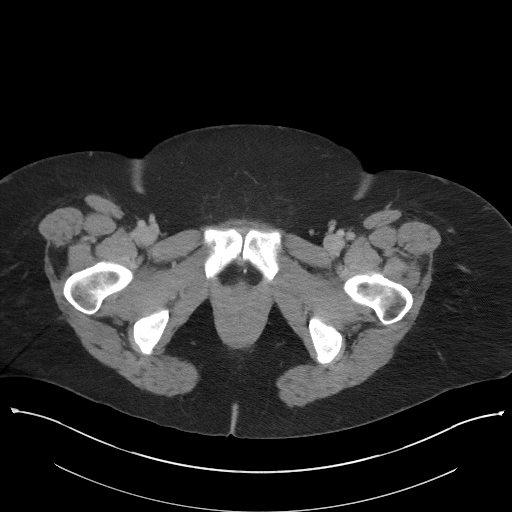
[im 23/108  soft-tissue]
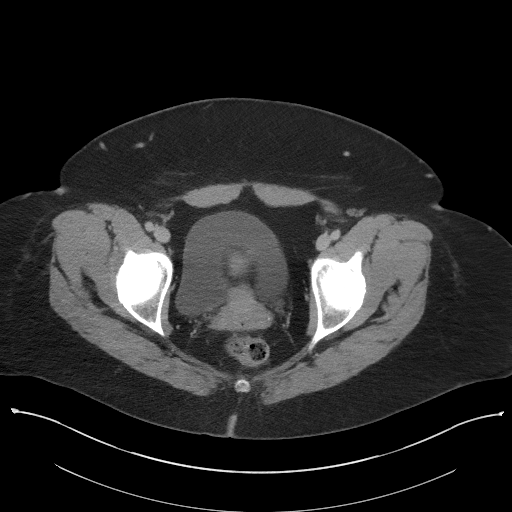
[im 32/108  soft-tissue]
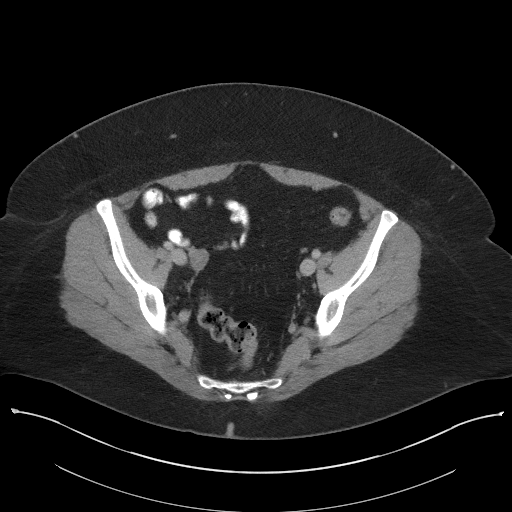
[im 36/108  soft-tissue]
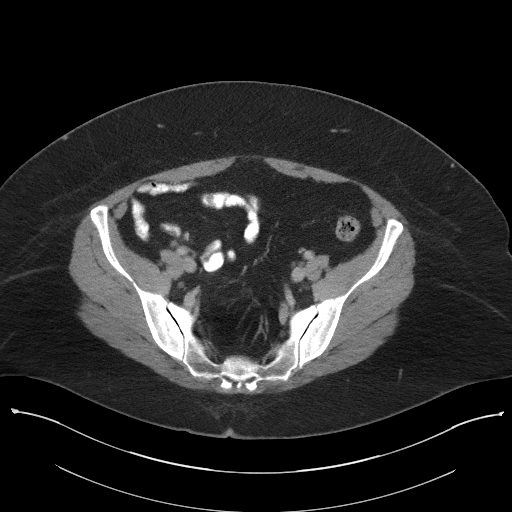
[im 45/108  soft-tissue]
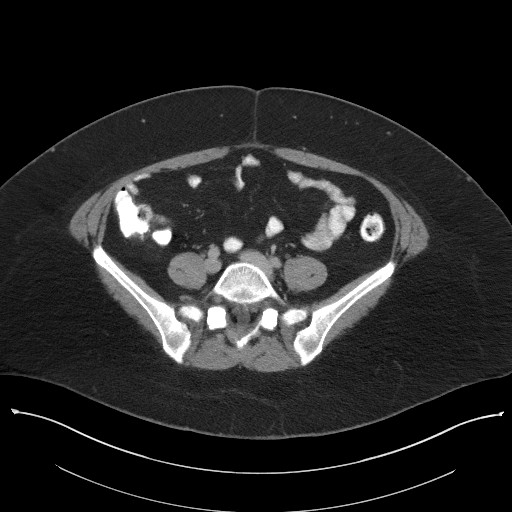
[im 54/108  soft-tissue]
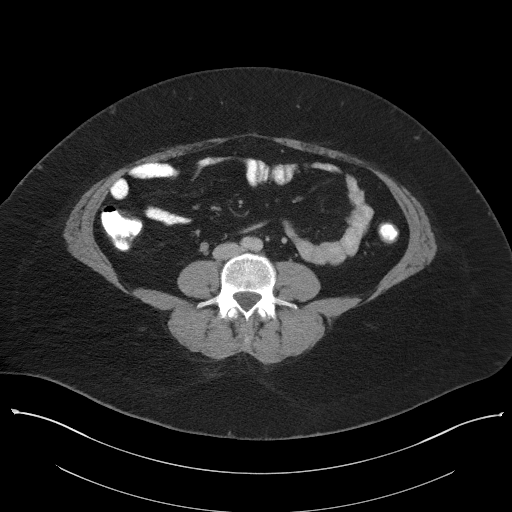
[im 63/108  soft-tissue]
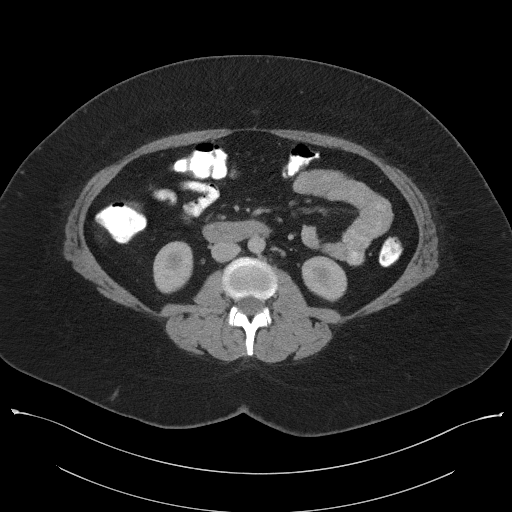
[im 72/108  soft-tissue]
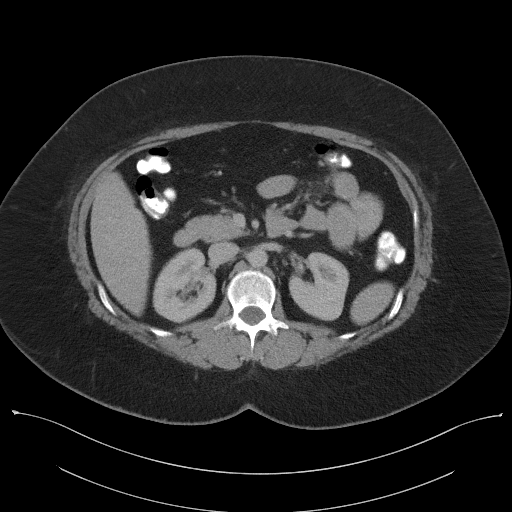
[im 72/108  bone]
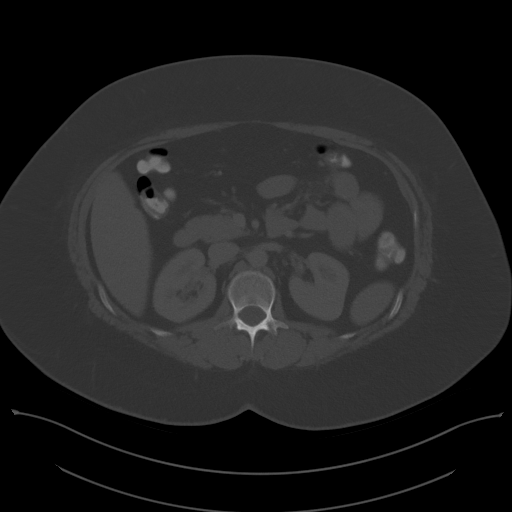
[im 76/108  soft-tissue]
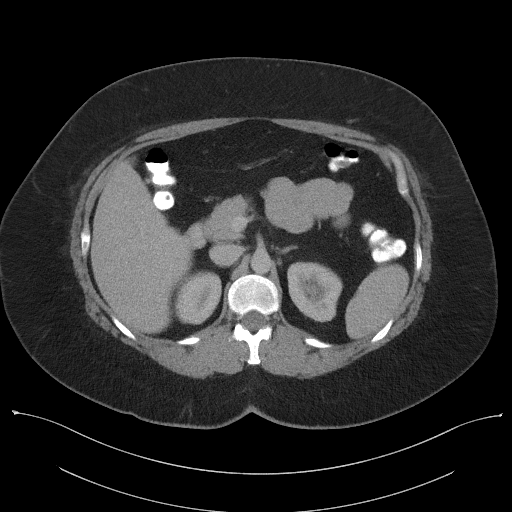
[im 85/108  soft-tissue]
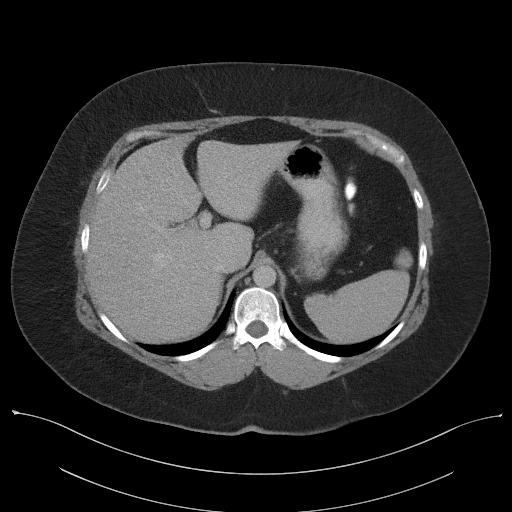
[im 94/108  soft-tissue]
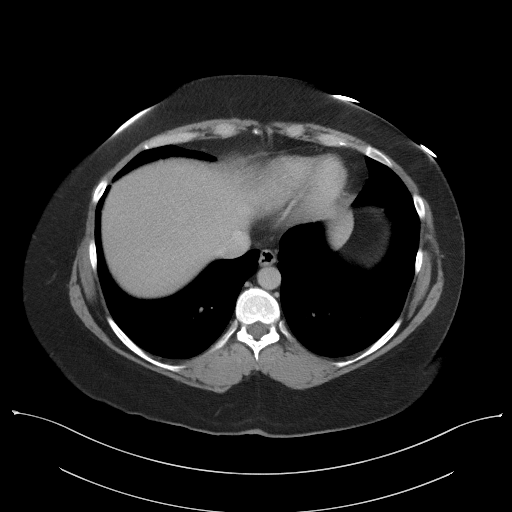
[im 103/108  soft-tissue]
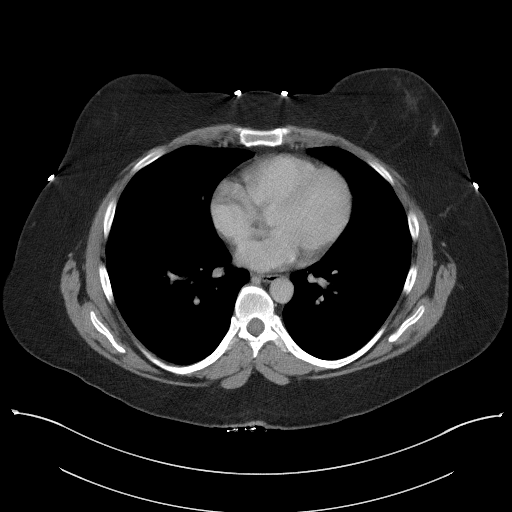

[Series 5: coronal st · coronal · 0.94mm/px · 3 of 93 slices shown]
[im 31/93  soft-tissue]
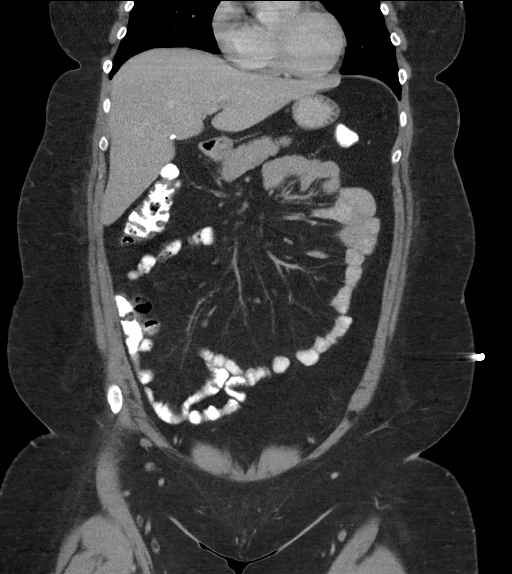
[im 41/93  soft-tissue]
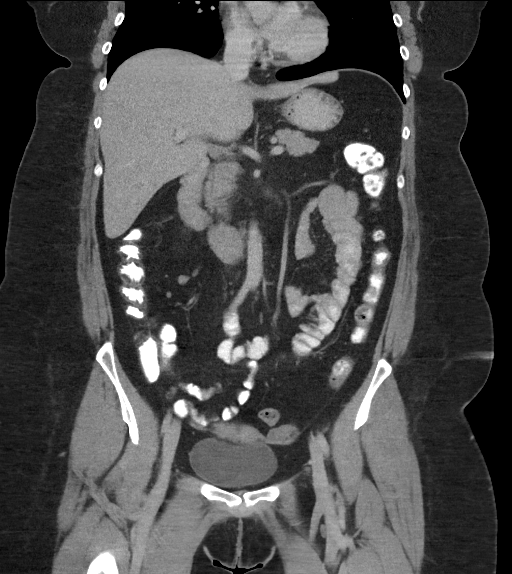
[im 52/93  soft-tissue]
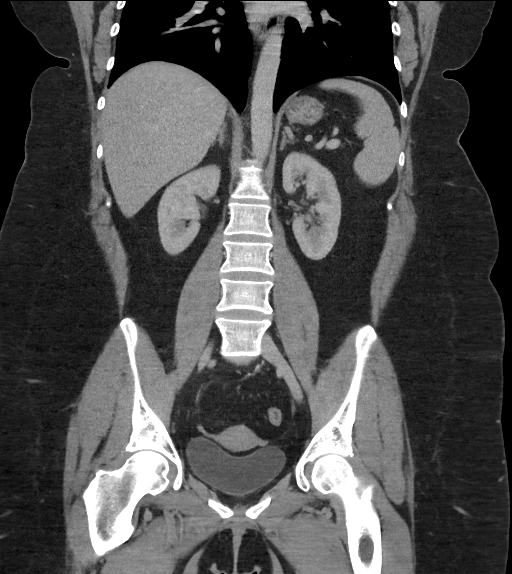

[16 of 46 positions shown; findings below may reference images not displayed]

FINDINGS: Lower chest: The lung bases are clear of acute process. No pleural
effusion or pulmonary lesions. The heart is normal in size. No
pericardial effusion. The distal esophagus and aorta are
unremarkable.

Hepatobiliary: No focal hepatic lesions or intrahepatic biliary
dilatation. The gallbladder is surgically absent. No common bile
duct dilatation.

Pancreas: No mass, inflammation or ductal dilatation.

Spleen: Normal size.  No focal lesions.

Adrenals/Urinary Tract: The adrenal glands and kidneys are normal.
No renal, ureteral or bladder calculi or mass.

Stomach/Bowel: The stomach, duodenum, small bowel and colon are
unremarkable. No acute inflammatory changes, mass lesions or
obstructive findings. The terminal ileum is normal. The appendix is
normal.

Vascular/Lymphatic: The aorta is normal in caliber. No dissection.
The branch vessels are patent. The major venous structures are
patent. No mesenteric or retroperitoneal mass or adenopathy. Small
scattered lymph nodes are noted.

Reproductive: The uterus and ovaries are unremarkable.

Other: No pelvic mass or adenopathy. No free pelvic fluid
collections. No inguinal mass or adenopathy. No abdominal wall
hernia or subcutaneous lesions.

Musculoskeletal: No significant bony findings.
IMPRESSION: 1. No acute abdominal/pelvic findings, mass lesions or
lymphadenopathy.
2. Status post cholecystectomy.  No biliary dilatation.
3. Normal appearance of the uterus and ovaries and appendix.

## 2019-11-17 ENCOUNTER — Ambulatory Visit: Payer: BC Managed Care – PPO | Admitting: Gastroenterology

## 2019-11-22 ENCOUNTER — Ambulatory Visit: Payer: Self-pay

## 2020-01-04 ENCOUNTER — Other Ambulatory Visit: Payer: Self-pay | Admitting: Allergy & Immunology

## 2020-01-17 ENCOUNTER — Ambulatory Visit: Payer: BC Managed Care – PPO | Admitting: Allergy & Immunology

## 2020-02-21 ENCOUNTER — Encounter: Payer: Self-pay | Admitting: Allergy & Immunology

## 2020-02-21 ENCOUNTER — Other Ambulatory Visit: Payer: Self-pay

## 2020-02-21 ENCOUNTER — Ambulatory Visit (INDEPENDENT_AMBULATORY_CARE_PROVIDER_SITE_OTHER): Payer: BC Managed Care – PPO | Admitting: Allergy & Immunology

## 2020-02-21 VITALS — BP 126/82 | HR 82 | Temp 98.2°F | Resp 18 | Ht 67.0 in | Wt 267.2 lb

## 2020-02-21 DIAGNOSIS — J4540 Moderate persistent asthma, uncomplicated: Secondary | ICD-10-CM | POA: Diagnosis not present

## 2020-02-21 DIAGNOSIS — B999 Unspecified infectious disease: Secondary | ICD-10-CM

## 2020-02-21 DIAGNOSIS — J302 Other seasonal allergic rhinitis: Secondary | ICD-10-CM

## 2020-02-21 DIAGNOSIS — J3089 Other allergic rhinitis: Secondary | ICD-10-CM

## 2020-02-21 DIAGNOSIS — L501 Idiopathic urticaria: Secondary | ICD-10-CM | POA: Diagnosis not present

## 2020-02-21 DIAGNOSIS — M19049 Primary osteoarthritis, unspecified hand: Secondary | ICD-10-CM

## 2020-02-21 MED ORDER — MONTELUKAST SODIUM 10 MG PO TABS: ORAL_TABLET | ORAL | 5 refills | Status: DC

## 2020-02-21 MED ORDER — ALBUTEROL SULFATE HFA 108 (90 BASE) MCG/ACT IN AERS: 2.0000 | INHALATION_SPRAY | RESPIRATORY_TRACT | 1 refills | Status: DC | PRN

## 2020-02-21 NOTE — Patient Instructions (Addendum)
1. Chronic urticaria - We will continue with Xolair every two weeks - In the meantime, continue suppressive dosing of antihistamines:   - Morning: Allegra (fexofenadine) 1-2 tablets    - Evening: Zyrtec (cetirizine) 20mg  (two tablets) + Singulair (montelukast) 10mg  + Pepcid 40mg  daily  - You can change this dosing at home, decreasing the dose as needed or increasing the dosing as needed.   2. Seasonal and perennial allergic rhinitis (trees, weeds, indoor molds, dust mites and cockroach) - Continue with: Zyrtec (cetirizine) 10mg  tablet once daily, Singulair (montelukast) 10mg  daily and Flonase (fluticasone) one spray per nostril daily  - Spray the Flonase towards the sides of the nose. - You can use an extra dose of the antihistamine, if needed, for breakthrough symptoms.  - Consider nasal saline rinses 1-2 times daily to remove allergens from the nasal cavities as well as help with mucous clearance (this is especially helpful to do before the nasal sprays are given) - Consider allergy shots as a means of long-term control.  3. Moderate persistent asthma - Lung testing looked great today. - We are restarting the Xolair today.  - Daily controller medication(s): Symbicort 80/4.72mcg two puffs twice daily with spacer + Singulair 10mg  + Xolair  - Prior to physical activity: albuterol 2 puffs 10-15 minutes before physical activity. - Rescue medications: albuterol 4 puffs every 4-6 hours as needed - Asthma control goals:  * Full participation in all desired activities (may need albuterol before activity) * Albuterol use two time or less a week on average (not counting use with activity) * Cough interfering with sleep two time or less a month * Oral steroids no more than once a year * No hospitalizations  4. Recurrent infections - with urticaria - I am going to order a Primary Immunodeficiency genetic panel from [facility].  - Go to the [facility] office and get that test drawn.   5. Return in  about 3 months (around 05/21/2020).   Please inform of any Emergency Department visits, hospitalizations, or changes in symptoms. Call before going to the ED for breathing or allergy symptoms since we might be able to fit you in for a sick visit. Feel free to contact 4[facility] anytime with any questions, problems, or concerns.  It was a pleasure to see you again today!  Websites that have reliable patient information: 1. American Academy of Asthma, Allergy, and Immunology: www.aaaai.org 2. Food Allergy Research and Education (FARE): foodallergy.org 3. Mothers of Asthmatics: http://www.asthmacommunitynetwork.org 4. American College of Allergy, Asthma, and Immunology: www.acaai.org   COVID-19 Vaccine Information can be found at: For questions related to vaccine distribution or appointments, please email [email or web address] or call [phone].     "Like" 05/23/2020 on Facebook and Instagram for our latest updates!     HAPPY FALL!     Make sure you are registered to vote! If you have moved or changed any of your contact information, you will need to get this updated before voting!  In some cases, you MAY be able to register to vote online: [place]

## 2020-02-21 NOTE — Progress Notes (Signed)
FOLLOW UP  Date of Service/Encounter:  02/21/20   Assessment:   Chronic urticaria -improved on Xolair but with breakthrough urticaria closer to each Xolair injection  Seasonal and perennial allergic rhinitis(trees, weeds, indoor molds, dust mites and cockroach)  Moderate persistent asthma, uncomplicated - with improved control due to [employer] social situation with her ex husband  Recurrent infections in the setting of Raynaud's phenomenon as well as    Plan/Recommendations:   1. Chronic urticaria - We will continue with Xolair every two weeks - In the meantime, continue suppressive dosing of antihistamines:   - Morning: Allegra (fexofenadine) 1-2 tablets    - Evening: Zyrtec (cetirizine) 20mg  (two tablets) + Singulair (montelukast) 10mg  + Pepcid 40mg  daily  - You can change this dosing at home, decreasing the dose as needed or increasing the dosing as needed.   2. Seasonal and perennial allergic rhinitis (trees, weeds, indoor molds, dust mites and cockroach) - Continue with: Zyrtec (cetirizine) 10mg  tablet once daily, Singulair (montelukast) 10mg  daily and Flonase (fluticasone) one spray per nostril daily  - Spray the Flonase towards the sides of the nose. - You can use an extra dose of the antihistamine, if needed, for breakthrough symptoms.  - Consider nasal saline rinses 1-2 times daily to remove allergens from the nasal cavities as well as help with mucous clearance (this is especially helpful to do before the nasal sprays are given) - Consider allergy shots as a means of long-term control.  3. Moderate persistent asthma - Lung testing looked great today. - We are restarting the Xolair today.  - Daily controller medication(s): Symbicort 80/4.71mcg two puffs twice daily with spacer + Singulair 10mg  + Xolair  - Prior to physical activity: albuterol 2 puffs 10-15 minutes before physical activity. - Rescue medications: albuterol 4 puffs every 4-6 hours as  needed - Asthma control goals:  * Full participation in all desired activities (may need albuterol before activity) * Albuterol use two time or less a week on average (not counting use with activity) * Cough interfering with sleep two time or less a month * Oral steroids no more than once a year * No hospitalizations  4. Recurrent infections - with urticaria - I am going to order a Primary Immunodeficiency genetic panel from [facility].  - Go to the [facility] office and get that test drawn.   5. Return in about 3 months (around 05/21/2020).   Subjective:   Amy Russell is a 39 y.o. female presenting today for follow up of  Chief Complaint  Patient presents with  . Follow-up    Amy Russell has a history of the following: Patient Active Problem List   Diagnosis Date Noted  . Contact with and (suspected) exposure to mold (toxic) 10/09/2019  . Arthralgia 12/09/2018  . Uncontrolled moderate persistent asthma 12/08/2018  . Diarrhea   . Abdominal pain 09/24/2017  . GERD (gastroesophageal reflux disease) 09/24/2017  . Rectal bleeding 09/24/2017  . Chronic urticaria 09/07/2017  . Reactive airway disease 11/25/2015    History obtained from: chart review and patient.  Amy Russell is a 39 y.o. female presenting for a follow up visit. She was last seen in May 2021. At that time, her hives and her asthma were under good control with Xolair every two weeks. We continued with the use of Allegra in the morning and Zyrtec, Singulair, and Pepcid at night. For her allergic rhinitis, she remained on the antihistamines as well as the Flonase. Lung testing looked excellent.  We continued her on Symbicort two puffs BID.   In the interim, she got married. She is working on getting her last name changed. Her ex is still being an ex husband. She has been divorced for 8 years in total. He has a job and is making a lot of money. They never stay overnight at their biological father's  (Amy Russell's ex husband) house. There are some step children now who are there on the weekends.  Asthma/Respiratory Symptom History: She remains on Symbicort two puffs BID. Amy Russell's asthma has been well controlled. She has not required rescue medication, experienced nocturnal awakenings due to lower respiratory symptoms, nor have activities of daily living been limited. She has required no Emergency Department or Urgent Care visits for her asthma. She has required zero courses of systemic steroids for asthma exacerbations since the last visit. ACT score today is 20, indicating asthma symptom control. She has been using her rescue inhaler much more than usual.   Recurrent Infection Symptom History: She continues to have multiple infections. She estimates that she is on antibiotics and steroids every couple of months. She has not had a ton of issues lately but she still has them. In addition, she has some odd rheumatological issues. She gets sores on the roof of her mouth. She is getting some color changes of her hands. These are around every 2 months or so. This is very hit or miss. Episodes are variable, lasting upwards of a couple of days to a week. She suffers and they eventually resolve. Her mother has similar symptoms and immune issues. Her mother has never had a workup at all. Amy Russell reports issues with dry eyes, too.   Otherwise, there have been no changes to her past medical history, surgical history, family history, or social history.    Review of Systems  Constitutional: Negative.  Negative for chills, fever, malaise/fatigue and weight loss.  HENT: Negative.  Negative for congestion, ear discharge and ear pain.   Eyes: Negative for pain, discharge and redness.  Respiratory: Negative for cough, sputum production, shortness of breath and wheezing.   Cardiovascular: Negative.  Negative for chest pain and palpitations.  Gastrointestinal: Negative for abdominal pain, constipation, diarrhea,  heartburn, nausea and vomiting.  Skin: Negative.  Negative for itching and rash.  Neurological: Negative for dizziness and headaches.  Endo/Heme/Allergies: Negative for environmental allergies. Does not bruise/bleed easily.       Objective:   Blood pressure 126/82, pulse 82, temperature 98.2 F (36.8 C), temperature source Temporal, resp. rate 18, height 5\' 7"  (1.702 m), weight 267 lb 3.2 oz (121.2 kg), SpO2 96 %. Body mass index is 41.85 kg/m.   Physical Exam:  Physical Exam Constitutional:      Appearance: She is well-developed.  HENT:     Head: Normocephalic and atraumatic.     Right Ear: Tympanic membrane, ear canal and external ear normal.     Left Ear: Tympanic membrane, ear canal and external ear normal.     Nose: No nasal deformity, septal deviation, mucosal edema, rhinorrhea or epistaxis.     Right Turbinates: Enlarged and swollen.     Left Turbinates: Enlarged and swollen.     Right Sinus: No maxillary sinus tenderness or frontal sinus tenderness.     Left Sinus: No maxillary sinus tenderness or frontal sinus tenderness.     Mouth/Throat:     Mouth: Oropharynx is clear and moist. Mucous membranes are not pale and not dry.     Pharynx: Uvula  midline.  Eyes:     General:        Right eye: No discharge.        Left eye: No discharge.     Extraocular Movements: EOM normal.     Conjunctiva/sclera: Conjunctivae normal.     Right eye: Right conjunctiva is not injected. No chemosis.    Left eye: Left conjunctiva is not injected. No chemosis.    Pupils: Pupils are equal, round, and reactive to light.  Cardiovascular:     Rate and Rhythm: Normal rate and regular rhythm.     Heart sounds: Normal heart sounds.  Pulmonary:     Effort: Pulmonary effort is normal. No tachypnea, accessory muscle usage or respiratory distress.     Breath sounds: Normal breath sounds. No wheezing, rhonchi or rales.     Comments: Moving air well in all lung fields. No increased work of  breathing noted.  Chest:     Chest wall: No tenderness.  Lymphadenopathy:     Cervical: No cervical adenopathy.  Skin:    General: Skin is warm.     Capillary Refill: Capillary refill takes less than 2 seconds.     Coloration: Skin is not pale.     Findings: Rash present. No abrasion, erythema or petechiae. Rash is urticarial. Rash is not papular or vesicular.     Comments: Urticarial lesions on the bilateral arms with excoriations.  Neurological:     Mental Status: She is alert.  Psychiatric:        Mood and Affect: Mood and affect normal.      Diagnostic studies:    Spirometry: results normal (FEV1: 3.02/92%, FVC: 3.54/87%, FEV1/FVC: 85%).    Spirometry consistent with normal pattern.   Allergy Studies: none       Malachi Bonds, MD  [facility]

## 2020-02-22 ENCOUNTER — Encounter: Payer: Self-pay | Admitting: Allergy & Immunology

## 2020-03-12 ENCOUNTER — Telehealth: Payer: Self-pay | Admitting: Allergy & Immunology

## 2020-03-12 NOTE — Telephone Encounter (Signed)
Called patient to discuss genetic testing. 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 These genes are associated with immunodeficiencies that have nothing to do with her clinical picture and typically present early in childhood, so I do not think that there are relevant. I will make a copy for her to pick up when she comes to the [place] office for her next Xolair.   Malachi Bonds, MD [facility]

## 2020-03-13 ENCOUNTER — Ambulatory Visit (INDEPENDENT_AMBULATORY_CARE_PROVIDER_SITE_OTHER): Payer: BC Managed Care – PPO

## 2020-03-13 ENCOUNTER — Other Ambulatory Visit: Payer: Self-pay

## 2020-03-13 DIAGNOSIS — L501 Idiopathic urticaria: Secondary | ICD-10-CM | POA: Diagnosis not present

## 2020-03-21 ENCOUNTER — Encounter: Payer: Self-pay | Admitting: Family Medicine

## 2020-03-22 ENCOUNTER — Other Ambulatory Visit: Payer: Self-pay | Admitting: *Deleted

## 2020-03-22 MED ORDER — OMALIZUMAB 150 MG/ML ~~LOC~~ SOSY: 300.0000 mg | PREFILLED_SYRINGE | SUBCUTANEOUS | 11 refills | Status: DC

## 2020-03-22 NOTE — Telephone Encounter (Signed)
New ins and approval for Xolair syringes faxed to [facility] and Rx sent

## 2020-03-22 NOTE — Telephone Encounter (Signed)
This is a very common situation  Nurses  Please advise Laryssa  Everyone should stay at home at least for the next 5 days and stay away from others Please see the following for further details  Covid infection This is a viral process.  Mild cases are treated with supportive measures at home such as Tylenol rest fluids.  In some situations monoclonal antibodies may be appropriate depending on the patient's risk criteria.  The patient was educated regarding progressive illness including respiratory, persistent vomiting, change in mental status.  If any of these occur ER evaluation is recommended. Patient was educated about the following as well Covid-19 respiratory warning: Covid-19 is a virus that causes hypoxia (low oxygen level in blood) in some people. If you develop any changes in your usual breathing pattern: difficulty catching your breath, more short winded with activity or with resting, or anything that concerns you about your breathing, do not hesitate to go to the emergency department immediately for evaluation. Please do not delay to get treatment.   Agrees with plan of care discussed today. Understands warning signs to seek further care: Chest pain, shortness of breath, mental confusion, profuse vomiting, any significant change in health. Understands to follow-up if symptoms do not improve, or worsen.

## 2020-03-25 ENCOUNTER — Encounter: Payer: Self-pay | Admitting: Family Medicine

## 2020-03-26 ENCOUNTER — Telehealth: Payer: Self-pay | Admitting: Family Medicine

## 2020-03-26 DIAGNOSIS — Z6841 Body Mass Index (BMI) 40.0 and over, adult: Secondary | ICD-10-CM

## 2020-03-26 DIAGNOSIS — J4520 Mild intermittent asthma, uncomplicated: Secondary | ICD-10-CM

## 2020-03-26 NOTE — Addendum Note (Signed)
Addended by: Margaretha Sheffield on: 03/26/2020 02:27 PM   Modules accepted: Orders

## 2020-03-26 NOTE — Telephone Encounter (Signed)
Referral ordered in EPIC. 

## 2020-03-26 NOTE — Telephone Encounter (Signed)
Patient has underlying obesity and asthma issues  Tested positive yesterday Please see my chart message Please refer her to infusion clinic not sure if she will qualify or not but it is worth forwarding the order

## 2020-03-29 ENCOUNTER — Ambulatory Visit: Payer: Self-pay

## 2020-04-04 ENCOUNTER — Encounter: Payer: Self-pay | Admitting: Family Medicine

## 2020-04-04 ENCOUNTER — Telehealth (INDEPENDENT_AMBULATORY_CARE_PROVIDER_SITE_OTHER): Payer: BC Managed Care – PPO | Admitting: Family Medicine

## 2020-04-04 ENCOUNTER — Other Ambulatory Visit: Payer: Self-pay

## 2020-04-04 DIAGNOSIS — J019 Acute sinusitis, unspecified: Secondary | ICD-10-CM | POA: Diagnosis not present

## 2020-04-04 DIAGNOSIS — J4520 Mild intermittent asthma, uncomplicated: Secondary | ICD-10-CM

## 2020-04-04 MED ORDER — AMOXICILLIN 500 MG PO TABS: 500.0000 mg | ORAL_TABLET | Freq: Three times a day (TID) | ORAL | 0 refills | Status: DC

## 2020-04-04 NOTE — Progress Notes (Signed)
   Subjective:    Patient ID: Amy Russell, female    DOB: 21-Oct-1980, 40 y.o.   MRN: [id number]  Cough This is a new problem. Associated symptoms include nasal congestion. Associated symptoms comments: Sinus headache.   Positive for Covid 03/23/2020-Patient at 1st thought she is getting better now she relates a lot of sinus pressure feeling fatigued tired moderate headache discolored drainage denies any wheezing difficulty breathing or shortness of breath no nausea or vomiting or diarrhea at the moment Review of Systems  Respiratory: Positive for cough.    Virtual Visit via Telephone Note  I connected with Amy Russell on 04/04/20 at  4:10 PM EST by telephone and verified that I am speaking with the correct person using two identifiers.  Location: Patient: home Provider: office   I discussed the limitations, risks, security and privacy concerns of performing an evaluation and management service by telephone and the availability of in person appointments. I also discussed with the patient that there may be a patient responsible charge related to this service. The patient expressed understanding and agreed to proceed.   History of Present Illness:    Observations/Objective:   Assessment and Plan:   Follow Up Instructions:    I discussed the assessment and treatment plan with the patient. The patient was provided an opportunity to ask questions and all were answered. The patient agreed with the plan and demonstrated an understanding of the instructions.   The patient was advised to call back or seek an in-person evaluation if the symptoms worsen or if the condition fails to improve as anticipated.  I provided 20 minutes of non-face-to-face time during this encounter.         Objective:   Physical Exam  Today's visit was via telephone Physical exam was not possible for this visit       Assessment & Plan:  Recent Covid infection Persistent fatigue  tiredness along with sinus pressure pain coughing More than likely secondary infection Antibiotics Amoxil 3 times daily Do not feel x-rays lab work indicated If progressive troubles over the next week face-to-face visit would be recommended She has a O2 sat monitor and is watching her saturations she will call us if any progressive troubles

## 2020-04-04 NOTE — Telephone Encounter (Signed)
Front  This is Amy Russell's husband Please establish him with [place] as a new patient for myself.  He can set up an appointment when he so desires thanks  Please send Kyann message letting her know that his information has been registered with the front  Thanks-Dr. Lorin Picket

## 2020-04-12 ENCOUNTER — Encounter: Payer: Self-pay | Admitting: *Deleted

## 2020-04-17 ENCOUNTER — Other Ambulatory Visit: Payer: Self-pay | Admitting: *Deleted

## 2020-04-17 MED ORDER — OMALIZUMAB 150 MG/ML ~~LOC~~ SOSY: 300.0000 mg | PREFILLED_SYRINGE | SUBCUTANEOUS | 11 refills | Status: DC

## 2020-05-08 ENCOUNTER — Telehealth: Payer: Self-pay | Admitting: *Deleted

## 2020-05-08 NOTE — Telephone Encounter (Signed)
L/M for patient to advise Xolair ready at [facility] with phone number.  Pharmacy had been awaiting patient to sign up for [facility] program as required by patient Ins in order to dispense Rx

## 2020-05-09 NOTE — Telephone Encounter (Signed)
Advised patient delivery 3/8 and should check with clinic in pm or later to see if delivered and schedule appt to restart

## 2020-05-15 ENCOUNTER — Encounter: Payer: Self-pay | Admitting: Family Medicine

## 2020-05-15 ENCOUNTER — Other Ambulatory Visit: Payer: Self-pay

## 2020-05-15 ENCOUNTER — Ambulatory Visit: Payer: BC Managed Care – PPO | Admitting: Family Medicine

## 2020-05-15 VITALS — BP 132/88 | HR 101 | Temp 97.2°F | Ht 67.0 in | Wt 272.0 lb

## 2020-05-15 DIAGNOSIS — M545 Low back pain, unspecified: Secondary | ICD-10-CM | POA: Diagnosis not present

## 2020-05-15 DIAGNOSIS — R350 Frequency of micturition: Secondary | ICD-10-CM

## 2020-05-15 LAB — POCT URINALYSIS DIPSTICK
Spec Grav, UA: >=1.03 — AB (ref 1.010–1.025)
pH, UA: 5 (ref 5.0–8.0)

## 2020-05-15 NOTE — Progress Notes (Signed)
Patient ID: Amy Russell, female    DOB: [birth year], 40 y.o.   MRN: [id number]   Chief Complaint  Patient presents with  . Dysuria   Subjective:    HPI  Urinary frequency and low back pain for 2 days.  Pain in lower back.  feeling some pressure in bladder.  Pain started in back in low back.  Some pain before then in the back.   Going more frequent in past week. No blood in urine. No vaginal itching or discharge. H/o pyelonephritis when in HS. Denies trauma to back or heavy lifting.  Medications for back pain tylenol /ibuprofen. 800mg  when pain bad in back. Not drinking much caffeine.  Last uti in 2017- had ecoli, in past.  Had kidney infection in high school and to go to hospital.   Results for orders placed or performed in visit on 05/15/20  Urine Culture   Specimen: Urine   Urine  Result Value Ref Range   Urine Culture, Routine Final report    Organism ID, Bacteria Comment   POCT urinalysis dipstick  Result Value Ref Range   Color, UA     Clarity, UA     Glucose, UA     Bilirubin, UA     Ketones, UA     Spec Grav, UA >=1.030 (A) 1.010 - 1.025   Blood, UA     pH, UA 5.0 5.0 - 8.0   Protein, UA     Urobilinogen, UA     Nitrite, UA     Leukocytes, UA     Appearance     Odor     Medical History Amy Russell has a past medical history of Allergy, Asthma, Chronic urticaria (09/07/2017), GERD (gastroesophageal reflux disease), and Recurrent upper respiratory infection (URI).   Outpatient Encounter Medications as of 05/15/2020  Medication Sig  . acetaminophen (TYLENOL) 500 MG tablet Take 1,000 mg by mouth every 6 (six) hours as needed (for pain.).  07/15/2020 albuterol (VENTOLIN HFA) 108 (90 Base) MCG/ACT inhaler Inhale 2 puffs into the lungs every 4 (four) hours as needed for wheezing.  Marland Kitchen amoxicillin (AMOXIL) 500 MG tablet Take 1 tablet (500 mg total) by mouth 3 (three) times daily.  . budesonide-formoterol (SYMBICORT) 160-4.5 MCG/ACT inhaler Inhale 2 puffs into lungs BID   . cetirizine (ZYRTEC) 10 MG tablet Take 10 mg by mouth at bedtime.   . dicyclomine (BENTYL) 10 MG capsule TAKE 1 CAPSULE BEFORE MEALS AND 1 AT BEDTIME.  . diphenhydrAMINE (BENADRYL) 25 mg capsule Take 25 mg by mouth every 6 (six) hours as needed (hives).   . famotidine (PEPCID) 40 MG tablet Take 1 tablet (40 mg total) by mouth daily.  . fexofenadine (ALLEGRA) 180 MG tablet Take 180 mg by mouth daily.  . fluticasone (FLONASE) 50 MCG/ACT nasal spray Place 1 spray into both nostrils daily.  Marland Kitchen guaiFENesin (MUCINEX) 600 MG 12 hr tablet Take by mouth 2 (two) times daily as needed.  . hydrocortisone 2.5 % cream One bid to rash (Patient taking differently: as needed. One bid to rash)  . montelukast (SINGULAIR) 10 MG tablet TAKE 1 TABLET(10 MG) BY MOUTH AT BEDTIME  . omalizumab Marland Kitchen) 150 MG/ML prefilled syringe Inject 300 mg into the skin every 14 (fourteen) days.  . SYMBICORT 80-4.5 MCG/ACT inhaler Inhale 2 puffs into the lungs 2 (two) times daily.   Facility-Administered Encounter Medications as of 05/15/2020  Medication  . omalizumab 07/15/2020) injection 300 mg     Review of Systems  Constitutional:  Negative for chills and fever.  Respiratory: Negative for cough, shortness of breath and wheezing.   Cardiovascular: Negative for chest pain and leg swelling.  Gastrointestinal: Negative for abdominal pain, diarrhea, nausea and vomiting.  Genitourinary: Positive for dysuria and frequency. Negative for decreased urine volume, difficulty urinating, flank pain, hematuria, pelvic pain, urgency, vaginal bleeding, vaginal discharge and vaginal pain.  Musculoskeletal: Positive for back pain. Negative for arthralgias.  Skin: Negative for rash.  Neurological: Negative for dizziness, weakness and headaches.     Vitals BP 132/88   Pulse (!) 101   Temp (!) 97.2 F (36.2 C)   Ht 5\' 7"  (1.702 m)   Wt 272 lb (123.4 kg)   SpO2 100%   BMI 42.60 kg/m   Objective:   Physical Exam Vitals and nursing  note reviewed.  Constitutional:      General: She is not in acute distress.    Appearance: Normal appearance. She is not ill-appearing.  HENT:     Head: Normocephalic and atraumatic.  Eyes:     Extraocular Movements: Extraocular movements intact.     Conjunctiva/sclera: Conjunctivae normal.     Pupils: Pupils are equal, round, and reactive to light.  Cardiovascular:     Rate and Rhythm: Normal rate and regular rhythm.     Pulses: Normal pulses.     Heart sounds: Normal heart sounds.  Pulmonary:     Effort: Pulmonary effort is normal. No respiratory distress.     Breath sounds: Normal breath sounds. No wheezing, rhonchi or rales.  Abdominal:     General: Bowel sounds are normal. There is no distension.     Palpations: Abdomen is soft. There is no mass.     Tenderness: There is no abdominal tenderness. There is no right CVA tenderness, left CVA tenderness, guarding or rebound.     Hernia: No hernia is present.  Musculoskeletal:        General: Normal range of motion.     Right lower leg: No edema.     Left lower leg: No edema.     Comments: +ttp over bilateral paraspinal area in lumbar. No lumbar or thoracic spinous process tenderness.  Dec rom with flexion. Neg SLR bilaterally.  Normal ms LE bilaterally and normal sensation.   Skin:    General: Skin is warm and dry.     Findings: No lesion or rash.  Neurological:     General: No focal deficit present.     Mental Status: She is alert and oriented to person, place, and time.  Psychiatric:        Mood and Affect: Mood normal.        Behavior: Behavior normal.      Assessment and Plan   1. Urinary frequency - POCT urinalysis dipstick - Urine Culture  2. Acute bilateral low back pain without sciatica - Urine Culture    Urine Micro- mixed bacteria. No rbc.  UA- neg, high spec gravity. -recommending inc in water intake, avoid caffeine and other bladder irritants. And take azo for next 2-3 days if needed.   Back  pain- tylenol /ibuprofen prn, heat/ice, and stretches prn. Call if worsening pain, fever, or back pain. Pt in agreement.   Will culture urine and call with results.  F/u prn.  Return if symptoms worsen or fail to improve.  05/21/2020

## 2020-05-17 LAB — URINE CULTURE

## 2020-05-21 ENCOUNTER — Encounter: Payer: Self-pay | Admitting: Family Medicine

## 2020-05-22 ENCOUNTER — Ambulatory Visit: Payer: BC Managed Care – PPO | Admitting: Allergy & Immunology

## 2020-05-22 ENCOUNTER — Ambulatory Visit (INDEPENDENT_AMBULATORY_CARE_PROVIDER_SITE_OTHER): Payer: BC Managed Care – PPO | Admitting: *Deleted

## 2020-05-22 ENCOUNTER — Other Ambulatory Visit: Payer: Self-pay

## 2020-05-22 ENCOUNTER — Encounter: Payer: Self-pay | Admitting: Allergy & Immunology

## 2020-05-22 VITALS — BP 118/78 | HR 76 | Temp 98.6°F | Resp 18 | Ht 67.0 in | Wt 271.2 lb

## 2020-05-22 DIAGNOSIS — L501 Idiopathic urticaria: Secondary | ICD-10-CM

## 2020-05-22 DIAGNOSIS — B999 Unspecified infectious disease: Secondary | ICD-10-CM

## 2020-05-22 DIAGNOSIS — J302 Other seasonal allergic rhinitis: Secondary | ICD-10-CM

## 2020-05-22 DIAGNOSIS — J4540 Moderate persistent asthma, uncomplicated: Secondary | ICD-10-CM

## 2020-05-22 DIAGNOSIS — J3089 Other allergic rhinitis: Secondary | ICD-10-CM | POA: Diagnosis not present

## 2020-05-22 MED ORDER — MONTELUKAST SODIUM 10 MG PO TABS: ORAL_TABLET | ORAL | 5 refills | Status: DC

## 2020-05-22 MED ORDER — FAMOTIDINE 40 MG PO TABS: 40.0000 mg | ORAL_TABLET | Freq: Every day | ORAL | 5 refills | Status: DC

## 2020-05-22 NOTE — Progress Notes (Signed)
FOLLOW UP  Date of Service/Encounter:  05/22/20   Assessment:   Chronic urticaria -improved on Xolair but with breakthrough urticaria closer to each Xolair injection  Seasonal and perennial allergic rhinitis(trees, weeds, indoor molds, dust mites and cockroach)  Moderate persistent asthma, uncomplicated - with improved control due to [employer] social situation with her ex husband  Recurrent infections in the setting of Raynaud's phenomenon   Plan/Recommendations:   1. Chronic urticaria - We will continue with Xolair every two weeks.  - In the meantime, continue suppressive dosing of antihistamines:   - Morning: Allegra (fexofenadine) 1-2 tablets    - Evening: Zyrtec (cetirizine) 20mg  (two tablets) + Singulair (montelukast) 10mg  + Pepcid 40mg  daily  - You can change this dosing at home, decreasing the dose as needed or increasing the dosing as needed.   2. Seasonal and perennial allergic rhinitis (trees, weeds, indoor molds, dust mites and cockroach) - Continue with: Zyrtec (cetirizine) 10mg  tablet once daily, Singulair (montelukast) 10mg  daily and Flonase (fluticasone) one spray per nostril daily  - You can use an extra dose of the antihistamine, if needed, for breakthrough symptoms.  - Consider nasal saline rinses 1-2 times daily to remove allergens from the nasal cavities as well as help with mucous clearance (this is especially helpful to do before the nasal sprays are given) - Consider allergy shots as a means of long-term control.  3. Moderate persistent asthma - Lung testing looked great today. - Let know about refills and we can send those in.  - Daily controller medication(s): Symbicort 80/4.44mcg two puffs twice daily with spacer + Singulair 10mg  + Xolair every two weeks.  - Prior to physical activity: albuterol 2 puffs 10-15 minutes before physical activity. - Rescue medications: albuterol 4 puffs every 4-6 hours as needed - Asthma control goals:  *  Full participation in all desired activities (may need albuterol before activity) * Albuterol use two time or less a week on average (not counting use with activity) * Cough interfering with sleep two time or less a month * Oral steroids no more than once a year * No hospitalizations  4. Recurrent infections - with urticaria - I am going to order a Primary Immunodeficiency genetic panel from [facility].  - Go to the [facility] office and get that test drawn.   5. Follow up in 3 months or earlier if needed.   Subjective:   Amy Russell is a 40 y.o. female presenting today for follow up of  Chief Complaint  Patient presents with  . Asthma    Urticaria    Amy Russell has a history of the following: Patient Active Problem List   Diagnosis Date Noted  . Contact with and (suspected) exposure to mold (toxic) 10/09/2019  . Arthralgia 12/09/2018  . Uncontrolled moderate persistent asthma 12/08/2018  . Diarrhea   . Abdominal pain 09/24/2017  . GERD (gastroesophageal reflux disease) 09/24/2017  . Rectal bleeding 09/24/2017  . Chronic urticaria 09/07/2017  . Reactive airway disease 11/25/2015    History obtained from: chart review and patient.  Amy Russell is a 40 y.o. female presenting for a follow up visit. She was last seen in December 2021. At that time, we continued with Xolair every two weeks. We also continued with Allegra in the morning and cetirizine, Singulair, and Pepcid at night. For her rhinitis, we continued with cetrizine as well as Singulair and Flonase. Lung testing looked awesome. We continued with Symbicort 80/4.9mcg two puffs twice daily  as well as albuterol as needed. We ordered a PID panel from [facility].  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  Since last visit, she has done well.  She did have COVID19 in January. She continues to have a terrible COVID headache but this is improving over time. Now it is typically in the afternoons,  but it was constant prior to this. She overall is feeling better.   Asthma/Respiratory Symptom History: She did have worsening symptoms with asthma and the COVID-19. She has not been using her rescue inhalre much at all.  [name]'s asthma has been well controlled. She has not required rescue medication, experienced nocturnal awakenings due to lower respiratory symptoms, nor have activities of daily living been limited. She has required no Emergency Department or Urgent Care visits for her asthma. She has required zero courses of systemic steroids for asthma exacerbations since the last visit. ACT score today is 21, indicating excellent asthma symptom control.    Allergic Rhinitis Symptom History: She is not using allergy shots at all. She remains on the cetirizine as well as montelukast. She also is on allegra in the mornings for her urticaria. All of this together seems to keep everything in check.   Urticaria Symptom History: She continunes to have some intermittent outbreaks. She mostly has these on her face, which she covers up with concealer. With her antihistamines, she is well controolled from an urticaria component.    She did have COVID19. She did have to go on antibiotics following COVID19 for sinusitis. She was not placed on prednisone for the COVID19. She was not feeling wlel last week and everything seems to have worked its way out.   Otherwise, there have been no changes to her past medical history, surgical history, family history, or social history.    Review of Systems  Constitutional: Negative.  Negative for chills, fever, malaise/fatigue and weight loss.  HENT: Positive for congestion. Negative for ear discharge, ear pain and sinus pain.   Eyes: Negative for pain, discharge and redness.  Respiratory: Negative for cough, sputum production, shortness of breath and wheezing.   Cardiovascular: Negative.  Negative for chest pain and palpitations.  Gastrointestinal: Negative for  abdominal pain, constipation, diarrhea, heartburn, nausea and vomiting.  Skin: Positive for rash. Negative for itching.  Neurological: Negative for dizziness and headaches.  Endo/Heme/Allergies: Positive for environmental allergies. Does not bruise/bleed easily.       Objective:   Blood pressure 118/78, pulse 76, temperature 98.6 F (37 C), temperature source Temporal, resp. rate 18, height 5\' 7"  (1.702 m), weight 271 lb 3.2 oz (123 kg), SpO2 98 %. Body mass index is 42.48 kg/m.   Physical Exam:  Physical Exam Constitutional:      Appearance: She is well-developed.     Comments: Pleasant female.  Cooperative with the exam.  HENT:     Head: Normocephalic and atraumatic.     Right Ear: Tympanic membrane, ear canal and external ear normal.     Left Ear: Tympanic membrane, ear canal and external ear normal.     Nose: Mucosal edema and rhinorrhea present. No nasal deformity or septal deviation.     Right Turbinates: Enlarged and swollen.     Left Turbinates: Enlarged and swollen.     Right Sinus: No maxillary sinus tenderness or frontal sinus tenderness.     Left Sinus: No maxillary sinus tenderness or frontal sinus tenderness.     Mouth/Throat:     Mouth: Mucous membranes are not pale  and not dry.     Pharynx: Uvula midline.  Eyes:     General:        Right eye: No discharge.        Left eye: No discharge.     Conjunctiva/sclera: Conjunctivae normal.     Right eye: Right conjunctiva is not injected. No chemosis.    Left eye: Left conjunctiva is not injected. No chemosis.    Pupils: Pupils are equal, round, and reactive to light.  Cardiovascular:     Rate and Rhythm: Normal rate and regular rhythm.     Heart sounds: Normal heart sounds.  Pulmonary:     Effort: Pulmonary effort is normal. No tachypnea, accessory muscle usage or respiratory distress.     Breath sounds: Normal breath sounds. No wheezing, rhonchi or rales.  Chest:     Chest wall: No tenderness.   Lymphadenopathy:     Cervical: No cervical adenopathy.  Skin:    General: Skin is warm.     Capillary Refill: Capillary refill takes less than 2 seconds.     Coloration: Skin is not pale.     Findings: Rash present. No abrasion, erythema or petechiae. Rash is urticarial. Rash is not papular or vesicular.     Comments: She does have some faint urticaria on her bilateral hands.  Neurological:     Mental Status: She is alert.      Diagnostic studies: none        Malachi Bonds, MD  [facility]

## 2020-05-22 NOTE — Patient Instructions (Addendum)
1. Chronic urticaria - We will continue with Xolair every two weeks.  - In the meantime, continue suppressive dosing of antihistamines:   - Morning: Allegra (fexofenadine) 1-2 tablets    - Evening: Zyrtec (cetirizine) 20mg  (two tablets) + Singulair (montelukast) 10mg  + Pepcid 40mg  daily  - You can change this dosing at home, decreasing the dose as needed or increasing the dosing as needed.   2. Seasonal and perennial allergic rhinitis (trees, weeds, indoor molds, dust mites and cockroach) - Continue with: Zyrtec (cetirizine) 10mg  tablet once daily, Singulair (montelukast) 10mg  daily and Flonase (fluticasone) one spray per nostril daily  - You can use an extra dose of the antihistamine, if needed, for breakthrough symptoms.  - Consider nasal saline rinses 1-2 times daily to remove allergens from the nasal cavities as well as help with mucous clearance (this is especially helpful to do before the nasal sprays are given) - Consider allergy shots as a means of long-term control.  3. Moderate persistent asthma - Lung testing looked great today. - Let know about refills and we can send those in.  - Daily controller medication(s): Symbicort 80/4.74mcg two puffs twice daily with spacer + Singulair 10mg  + Xolair every two weeks.  - Prior to physical activity: albuterol 2 puffs 10-15 minutes before physical activity. - Rescue medications: albuterol 4 puffs every 4-6 hours as needed - Asthma control goals:  * Full participation in all desired activities (may need albuterol before activity) * Albuterol use two time or less a week on average (not counting use with activity) * Cough interfering with sleep two time or less a month * Oral steroids no more than once a year * No hospitalizations  4. Recurrent infections - with urticaria - Copy of genetic testing provided. - Continue to monitor the frequency of infections. - We can do more aggressive testing if indicated.   5. Return in about 6 months  (around 11/22/2020).    Please inform of any Emergency Department visits, hospitalizations, or changes in symptoms. Call before going to the ED for breathing or allergy symptoms since we might be able to fit you in for a sick visit. Feel free to contact us anytime with any questions, problems, or concerns.  It was a pleasure to see you again today!  Websites that have reliable patient information: 1. American Academy of Asthma, Allergy, and Immunology: www.aaaai.org 2. Food Allergy Research and Education (FARE): foodallergy.org 3. Mothers of Asthmatics: http://www.asthmacommunitynetwork.org 4. American College of Allergy, Asthma, and Immunology: www.acaai.org   COVID-19 Vaccine Information can be found at: 4m For questions related to vaccine distribution or appointments, please email [email or web address] or call [phone].   We realize that you might be concerned about having an allergic reaction to the COVID19 vaccines. To help with that concern, WE ARE OFFERING THE COVID19 VACCINES IN OUR OFFICE! Ask the front desk for dates!     "Like" 11/24/2020 on Facebook and Instagram for our latest updates!      A healthy democracy works best when [place] participate! Make sure you are registered to vote! If you have moved or changed any of your contact information, you will need to get this updated before voting!  In some cases, you MAY be able to register to vote online: [place]

## 2020-06-05 ENCOUNTER — Ambulatory Visit (INDEPENDENT_AMBULATORY_CARE_PROVIDER_SITE_OTHER): Payer: BC Managed Care – PPO

## 2020-06-05 ENCOUNTER — Other Ambulatory Visit: Payer: Self-pay

## 2020-06-05 DIAGNOSIS — L501 Idiopathic urticaria: Secondary | ICD-10-CM

## 2020-06-19 ENCOUNTER — Other Ambulatory Visit: Payer: Self-pay

## 2020-06-19 ENCOUNTER — Ambulatory Visit (INDEPENDENT_AMBULATORY_CARE_PROVIDER_SITE_OTHER): Payer: BC Managed Care – PPO

## 2020-06-19 DIAGNOSIS — L501 Idiopathic urticaria: Secondary | ICD-10-CM

## 2020-06-19 MED ORDER — OMALIZUMAB 150 MG ~~LOC~~ SOLR
300.0000 mg | SUBCUTANEOUS | Status: DC
  Administered 2020-06-19 – 2020-09-18 (×7): 300 mg via SUBCUTANEOUS

## 2020-07-03 ENCOUNTER — Other Ambulatory Visit: Payer: Self-pay

## 2020-07-03 ENCOUNTER — Ambulatory Visit (INDEPENDENT_AMBULATORY_CARE_PROVIDER_SITE_OTHER): Payer: BC Managed Care – PPO

## 2020-07-03 DIAGNOSIS — L501 Idiopathic urticaria: Secondary | ICD-10-CM | POA: Diagnosis not present

## 2020-07-19 ENCOUNTER — Other Ambulatory Visit: Payer: Self-pay

## 2020-07-19 ENCOUNTER — Ambulatory Visit (INDEPENDENT_AMBULATORY_CARE_PROVIDER_SITE_OTHER): Payer: BC Managed Care – PPO

## 2020-07-19 DIAGNOSIS — L501 Idiopathic urticaria: Secondary | ICD-10-CM | POA: Diagnosis not present

## 2020-07-31 ENCOUNTER — Other Ambulatory Visit: Payer: Self-pay

## 2020-07-31 ENCOUNTER — Ambulatory Visit (INDEPENDENT_AMBULATORY_CARE_PROVIDER_SITE_OTHER): Payer: BC Managed Care – PPO

## 2020-07-31 DIAGNOSIS — L501 Idiopathic urticaria: Secondary | ICD-10-CM

## 2020-08-14 ENCOUNTER — Ambulatory Visit (INDEPENDENT_AMBULATORY_CARE_PROVIDER_SITE_OTHER): Payer: BC Managed Care – PPO

## 2020-08-14 ENCOUNTER — Other Ambulatory Visit: Payer: Self-pay

## 2020-08-14 DIAGNOSIS — L501 Idiopathic urticaria: Secondary | ICD-10-CM | POA: Diagnosis not present

## 2020-09-04 ENCOUNTER — Other Ambulatory Visit: Payer: Self-pay

## 2020-09-04 ENCOUNTER — Ambulatory Visit (INDEPENDENT_AMBULATORY_CARE_PROVIDER_SITE_OTHER): Payer: BC Managed Care – PPO

## 2020-09-04 DIAGNOSIS — L501 Idiopathic urticaria: Secondary | ICD-10-CM | POA: Diagnosis not present

## 2020-09-05 ENCOUNTER — Encounter: Payer: Self-pay | Admitting: Family Medicine

## 2020-09-05 NOTE — Telephone Encounter (Signed)
Nurses Recommend office visit for Amy Russell tomorrow at 11:40 AM In the meantime warm compresses 15 to 20 minutes at a time every 2 hours while awake Thanks-Dr. Lorin Picket

## 2020-09-05 NOTE — Telephone Encounter (Signed)
Pt contacted and verbalized understanding. Pt placed on schedule for 11:40 am tomorrow

## 2020-09-06 ENCOUNTER — Ambulatory Visit: Payer: BC Managed Care – PPO | Admitting: Family Medicine

## 2020-09-06 ENCOUNTER — Other Ambulatory Visit: Payer: Self-pay

## 2020-09-06 VITALS — BP 123/84 | HR 95 | Temp 98.4°F | Ht 67.0 in | Wt 270.0 lb

## 2020-09-06 DIAGNOSIS — N611 Abscess of the breast and nipple: Secondary | ICD-10-CM | POA: Diagnosis not present

## 2020-09-06 MED ORDER — DOXYCYCLINE HYCLATE 100 MG PO TABS: 100.0000 mg | ORAL_TABLET | Freq: Two times a day (BID) | ORAL | 0 refills | Status: DC

## 2020-09-06 NOTE — Patient Instructions (Signed)
Please continue to do the warm compresses 20 minutes at a time every 2 hours while awake  This should gradually heal up over the course of the next several days  I would recommend antibiotics twice daily for 7 days if you are pretty much healed up in 5 days you can stop the antibiotic

## 2020-09-06 NOTE — Progress Notes (Signed)
   Subjective:    Patient ID: Amy Russell, female    DOB: 1980/03/31, 40 y.o.   MRN: [id number]  HPIcyst on left breast.  Patient had onset pain discomfort and a swollen cyst tenderness.  Denied any other particular trouble.  Then it drained.  Causing mucoid drainage.  Denies any other trouble. Patient Review of Systems     Objective:   Physical Exam  Ruptured abscess noted.  Some localized cellulitis.  No deep abscess noted.  Regional breast tissue normal.  Nurse present during the exam      Assessment & Plan:  Cellulitis with ruptured abscess should gradually get better with warm compresses doxycycline twice daily for 7 days if dramatically better within the next 5 days stop doxycycline follow-up if progressive troubles  Female wellness checkup on a regular basis including breast exam with her gynecologist patient states she will schedule

## 2020-09-18 ENCOUNTER — Other Ambulatory Visit: Payer: Self-pay

## 2020-09-18 ENCOUNTER — Ambulatory Visit (INDEPENDENT_AMBULATORY_CARE_PROVIDER_SITE_OTHER): Payer: BC Managed Care – PPO

## 2020-09-18 DIAGNOSIS — L501 Idiopathic urticaria: Secondary | ICD-10-CM | POA: Diagnosis not present

## 2020-09-19 ENCOUNTER — Encounter: Payer: Self-pay | Admitting: Family Medicine

## 2020-09-19 ENCOUNTER — Ambulatory Visit (INDEPENDENT_AMBULATORY_CARE_PROVIDER_SITE_OTHER): Payer: BC Managed Care – PPO | Admitting: Family Medicine

## 2020-09-19 VITALS — HR 100 | Temp 98.4°F | Ht 67.0 in | Wt 270.6 lb

## 2020-09-19 DIAGNOSIS — R059 Cough, unspecified: Secondary | ICD-10-CM

## 2020-09-19 DIAGNOSIS — J0100 Acute maxillary sinusitis, unspecified: Secondary | ICD-10-CM | POA: Diagnosis not present

## 2020-09-19 MED ORDER — FLUCONAZOLE 150 MG PO TABS: ORAL_TABLET | ORAL | 0 refills | Status: DC

## 2020-09-19 MED ORDER — HYDROXYZINE HCL 25 MG PO TABS: ORAL_TABLET | ORAL | 0 refills | Status: DC

## 2020-09-19 MED ORDER — AMOXICILLIN-POT CLAVULANATE 875-125 MG PO TABS: 1.0000 | ORAL_TABLET | Freq: Two times a day (BID) | ORAL | 0 refills | Status: DC

## 2020-09-19 MED ORDER — PREDNISONE 20 MG PO TABS
40.0000 mg | ORAL_TABLET | Freq: Every day | ORAL | 0 refills | Status: DC
Start: 2020-09-19 — End: 2020-09-25

## 2020-09-19 NOTE — Telephone Encounter (Signed)
Pt contacted office and has appt with Dr.Taylor today

## 2020-09-19 NOTE — Progress Notes (Signed)
Patient ID: Amy Russell, female    DOB: 08-Jan-1981, 40 y.o.   MRN: [id number]   Chief Complaint  Patient presents with   Fever    Congestion since yesterday. Patient states she has had cough for a month but it has gotten deeper   Subjective:    HPI CC- uri/fever Had coughing for about a month, using delsym. Dad sick also and not feeling like she should be around him in case she has something contagious. Fever- last night Had 4 covid testing and all negative. Low grade temp for her at 38F.  Light sputum, clear.  Sore from coughing.  No ear pain.  Not working with public. Sick contacts- none.  Having some insomnia with coughing and sick and taking care of father.   Medical History Amy Russell has a past medical history of Allergy, Asthma, Chronic urticaria (09/07/2017), GERD (gastroesophageal reflux disease), and Recurrent upper respiratory infection (URI).   Outpatient Encounter Medications as of 09/19/2020  Medication Sig   amoxicillin-clavulanate (AUGMENTIN) 875-125 MG tablet Take 1 tablet by mouth 2 (two) times daily.   fluconazole (DIFLUCAN) 150 MG tablet Take 1 tablet p.o. prn vaginal itching. May repeat in 7 days.   hydrOXYzine (ATARAX/VISTARIL) 25 MG tablet Take 1-2 tab p.o. for insomnia.   [DISCONTINUED] predniSONE (DELTASONE) 20 MG tablet Take 2 tablets (40 mg total) by mouth daily with breakfast.   acetaminophen (TYLENOL) 500 MG tablet Take 1,000 mg by mouth every 6 (six) hours as needed (for pain.).   albuterol (VENTOLIN HFA) 108 (90 Base) MCG/ACT inhaler Inhale 2 puffs into the lungs every 4 (four) hours as needed for wheezing.   budesonide-formoterol (SYMBICORT) 160-4.5 MCG/ACT inhaler Inhale 2 puffs into lungs BID   cetirizine (ZYRTEC) 10 MG tablet Take 10 mg by mouth at bedtime.    dicyclomine (BENTYL) 10 MG capsule TAKE 1 CAPSULE BEFORE MEALS AND 1 AT BEDTIME.   diphenhydrAMINE (BENADRYL) 25 mg capsule Take 25 mg by mouth every 6 (six) hours as needed  (hives).    doxycycline (VIBRA-TABS) 100 MG tablet Take 1 tablet (100 mg total) by mouth 2 (two) times daily.   famotidine (PEPCID) 40 MG tablet Take 1 tablet (40 mg total) by mouth daily.   fexofenadine (ALLEGRA) 180 MG tablet Take 180 mg by mouth daily.   fluticasone (FLONASE) 50 MCG/ACT nasal spray Place 1 spray into both nostrils daily.   guaiFENesin (MUCINEX) 600 MG 12 hr tablet Take by mouth 2 (two) times daily as needed.   hydrocortisone 2.5 % cream One bid to rash (Patient taking differently: as needed. One bid to rash)   montelukast (SINGULAIR) 10 MG tablet TAKE 1 TABLET(10 MG) BY MOUTH AT BEDTIME   omalizumab (XOLAIR) 150 MG/ML prefilled syringe Inject 300 mg into the skin every 14 (fourteen) days.   ondansetron (ZOFRAN) 8 MG tablet Take 8 mg by mouth 3 (three) times daily.   SYMBICORT 80-4.5 MCG/ACT inhaler Inhale 2 puffs into the lungs 2 (two) times daily.   Facility-Administered Encounter Medications as of 09/19/2020  Medication   omalizumab Geoffry Paradise) injection 300 mg   omalizumab Geoffry Paradise) injection 300 mg     Review of Systems  Constitutional:  Positive for fever. Negative for chills.  HENT:  Positive for sore throat. Negative for congestion, ear pain, rhinorrhea, sinus pressure and sinus pain.   Respiratory:  Positive for cough. Negative for shortness of breath and wheezing.   Cardiovascular:  Negative for chest pain and leg swelling.  Gastrointestinal:  Negative for  abdominal pain, diarrhea, nausea and vomiting.  Genitourinary:  Negative for dysuria and frequency.  Musculoskeletal:  Negative for arthralgias and back pain.  Skin:  Negative for rash.  Neurological:  Negative for dizziness, weakness and headaches.    Vitals Pulse 100   Temp 98.4 F (36.9 C) (Oral)   Ht 5\' 7"  (1.702 m)   Wt 270 lb 9.6 oz (122.7 kg)   SpO2 98%   BMI 42.38 kg/m   Objective:   Physical Exam Vitals and nursing note reviewed.  Constitutional:      General: She is not in acute  distress.    Appearance: Normal appearance. She is not ill-appearing or toxic-appearing.  HENT:     Head: Normocephalic and atraumatic.     Right Ear: Tympanic membrane, ear canal and external ear normal.     Left Ear: Tympanic membrane, ear canal and external ear normal.     Nose: Nose normal. No congestion or rhinorrhea.     Mouth/Throat:     Mouth: Mucous membranes are moist.     Pharynx: Oropharynx is clear. No oropharyngeal exudate or posterior oropharyngeal erythema.  Eyes:     Extraocular Movements: Extraocular movements intact.     Conjunctiva/sclera: Conjunctivae normal.     Pupils: Pupils are equal, round, and reactive to light.  Cardiovascular:     Rate and Rhythm: Normal rate and regular rhythm.     Pulses: Normal pulses.     Heart sounds: Normal heart sounds.  Pulmonary:     Effort: Pulmonary effort is normal. No respiratory distress.     Breath sounds: Normal breath sounds. No wheezing, rhonchi or rales.  Musculoskeletal:     Cervical back: Normal range of motion.  Lymphadenopathy:     Cervical: No cervical adenopathy.  Skin:    General: Skin is warm and dry.     Findings: No rash.  Neurological:     Mental Status: She is alert and oriented to person, place, and time.  Psychiatric:        Mood and Affect: Mood normal.        Behavior: Behavior normal.     Assessment and Plan   1. Acute non-recurrent maxillary sinusitis - amoxicillin-clavulanate (AUGMENTIN) 875-125 MG tablet; Take 1 tablet by mouth 2 (two) times daily.  Dispense: 20 tablet; Refill: 0  2. Cough - Novel Coronavirus, NAA ([facility]) - SARS-COV-2, NAA 2 DAY TAT - Specimen status report    Pt given prednisone and augmentin.  Pt also requesting diflucan for yeast infection and hydoxyzine given for insomnia. Cont otc cough syrup and inc fluids.  Pending- covid testing.  Cont to quarantine till results.   Return if symptoms worsen or fail to improve.

## 2020-09-20 LAB — SPECIMEN STATUS REPORT

## 2020-09-20 LAB — SARS-COV-2, NAA 2 DAY TAT

## 2020-09-20 LAB — NOVEL CORONAVIRUS, NAA: SARS-CoV-2, NAA: NOT DETECTED

## 2020-09-25 ENCOUNTER — Other Ambulatory Visit: Payer: Self-pay | Admitting: Nurse Practitioner

## 2020-09-25 DIAGNOSIS — R509 Fever, unspecified: Secondary | ICD-10-CM

## 2020-09-25 DIAGNOSIS — R059 Cough, unspecified: Secondary | ICD-10-CM

## 2020-09-25 MED ORDER — PREDNISONE 20 MG PO TABS: ORAL_TABLET | ORAL | 0 refills | Status: DC

## 2020-09-26 ENCOUNTER — Other Ambulatory Visit: Payer: Self-pay

## 2020-09-26 ENCOUNTER — Other Ambulatory Visit: Payer: Self-pay | Admitting: Nurse Practitioner

## 2020-09-26 ENCOUNTER — Ambulatory Visit (HOSPITAL_COMMUNITY)
Admission: RE | Admit: 2020-09-26 | Discharge: 2020-09-26 | Disposition: A | Discharge status: Home / Self Care | Payer: BC Managed Care – PPO | Source: Ambulatory Visit | Attending: Nurse Practitioner | Admitting: Nurse Practitioner

## 2020-09-26 DIAGNOSIS — R059 Cough, unspecified: Secondary | ICD-10-CM

## 2020-09-26 DIAGNOSIS — R509 Fever, unspecified: Secondary | ICD-10-CM | POA: Insufficient documentation

## 2020-09-26 DIAGNOSIS — R0789 Other chest pain: Secondary | ICD-10-CM | POA: Diagnosis not present

## 2020-09-26 MED ORDER — HYDROCODONE BIT-HOMATROP MBR 5-1.5 MG/5ML PO SOLN: 5.0000 mL | Freq: Three times a day (TID) | ORAL | 0 refills | Status: DC | PRN

## 2020-10-02 ENCOUNTER — Ambulatory Visit: Payer: Self-pay

## 2020-10-30 ENCOUNTER — Ambulatory Visit (INDEPENDENT_AMBULATORY_CARE_PROVIDER_SITE_OTHER): Payer: BC Managed Care – PPO

## 2020-10-30 ENCOUNTER — Other Ambulatory Visit: Payer: Self-pay

## 2020-10-30 DIAGNOSIS — L501 Idiopathic urticaria: Secondary | ICD-10-CM | POA: Diagnosis not present

## 2020-10-30 MED ORDER — OMALIZUMAB 150 MG/ML ~~LOC~~ SOSY
300.0000 mg | PREFILLED_SYRINGE | SUBCUTANEOUS | Status: AC
  Administered 2020-10-30 – 2021-05-09 (×13): 300 mg via SUBCUTANEOUS

## 2020-11-13 ENCOUNTER — Other Ambulatory Visit: Payer: Self-pay

## 2020-11-13 ENCOUNTER — Ambulatory Visit (INDEPENDENT_AMBULATORY_CARE_PROVIDER_SITE_OTHER): Payer: BC Managed Care – PPO

## 2020-11-13 DIAGNOSIS — L501 Idiopathic urticaria: Secondary | ICD-10-CM | POA: Diagnosis not present

## 2020-11-20 ENCOUNTER — Ambulatory Visit: Payer: BC Managed Care – PPO | Admitting: Allergy & Immunology

## 2020-11-27 ENCOUNTER — Other Ambulatory Visit: Payer: Self-pay

## 2020-11-27 ENCOUNTER — Ambulatory Visit (INDEPENDENT_AMBULATORY_CARE_PROVIDER_SITE_OTHER): Payer: BC Managed Care – PPO

## 2020-11-27 DIAGNOSIS — L501 Idiopathic urticaria: Secondary | ICD-10-CM

## 2020-12-11 ENCOUNTER — Other Ambulatory Visit: Payer: Self-pay

## 2020-12-11 ENCOUNTER — Ambulatory Visit (INDEPENDENT_AMBULATORY_CARE_PROVIDER_SITE_OTHER): Payer: BC Managed Care – PPO | Admitting: Allergy & Immunology

## 2020-12-11 ENCOUNTER — Ambulatory Visit: Payer: BC Managed Care – PPO

## 2020-12-11 DIAGNOSIS — J0100 Acute maxillary sinusitis, unspecified: Secondary | ICD-10-CM

## 2020-12-11 DIAGNOSIS — B999 Unspecified infectious disease: Secondary | ICD-10-CM

## 2020-12-11 DIAGNOSIS — J302 Other seasonal allergic rhinitis: Secondary | ICD-10-CM

## 2020-12-11 DIAGNOSIS — J4540 Moderate persistent asthma, uncomplicated: Secondary | ICD-10-CM

## 2020-12-11 DIAGNOSIS — L501 Idiopathic urticaria: Secondary | ICD-10-CM | POA: Diagnosis not present

## 2020-12-11 DIAGNOSIS — J3089 Other allergic rhinitis: Secondary | ICD-10-CM | POA: Diagnosis not present

## 2020-12-11 MED ORDER — FAMOTIDINE 40 MG PO TABS: 40.0000 mg | ORAL_TABLET | Freq: Every day | ORAL | 5 refills | Status: DC

## 2020-12-11 MED ORDER — HYDROXYZINE HCL 25 MG PO TABS: ORAL_TABLET | ORAL | 5 refills | Status: DC

## 2020-12-11 MED ORDER — FEXOFENADINE HCL 180 MG PO TABS: 180.0000 mg | ORAL_TABLET | Freq: Every day | ORAL | 5 refills | Status: AC

## 2020-12-11 MED ORDER — MONTELUKAST SODIUM 10 MG PO TABS: ORAL_TABLET | ORAL | 5 refills | Status: DC

## 2020-12-11 NOTE — Progress Notes (Signed)
RE: Amy Russell MRN: [id number] DOB: [birth year] Date of Telemedicine Visit: 12/11/2020  Referring provider: Kathyrn Drown, MD Primary care provider: Kathyrn Drown, MD  Chief Complaint: No chief complaint on file.   Telemedicine Follow Up Visit via Telephone: I connected with Amy Russell for a follow up on 12/11/20 by telephone and verified that I am speaking with the correct person using two identifiers.   I discussed the limitations, risks, security and privacy concerns of performing an evaluation and management service by telephone and the availability of in person appointments. I also discussed with the patient that there may be a patient responsible charge related to this service. The patient expressed understanding and agreed to proceed.  Patient is at home.  Provider is at the office.  Visit start time: 11:19 AM Visit end time: 11:35 AM Insurance consent/check in by: [facility] consent and medical assistant/nurse: Cree  History of Present Illness:  She is a 40 y.o. female, who is being followed for chronic urticaria as well as seasonal perennial allergic rhinitis and moderate persistent asthma. Her previous allergy office visit was in March 2022 with myself.  At that time, her urticaria was controlled with Allegra in the morning and Zyrtec at night.  She also remained on Singulair and Pepcid.  For her allergic rhinitis, would continue with Zyrtec, Singulair, and Flonase.  Her asthma was controlled with Symbicort 80 mcg 2 puffs twice daily as well as Singulair and Xolair.  She continued to have a history of recurrent infections.  Genetic testing from [facility] was positive only to 5 variants of unknown significance in genes that cause syndromes not fitting her clinical picture.  Since last visit, she has mostly done well.  She continues to receive her Xolair every 2 weeks.  She reports today that she has had 4 days of headache as well as sinus pain and pressure.   She did have some nasal discharge.  Last night, symptoms acutely worsen.  She has been using over-the-counter medications including Mucinex and sinus rinses with minimal relief.  Her last antibiotic course was in July.  In fact, she had 2 rounds of antibiotics in July.  She did do a COVID test at home, but she realized her test kit was expired.  She denies any sick contacts.  Her asthma has otherwise been well controlled.  She remains on the Symbicort 2 puffs twice daily and the Xolair.  She has not been using her rescue inhaler much at all.  She has been stressed lately because her father who is nearing 47 or so was hospitalized over the summer for sepsis.  He is doing much better, but she has been visiting him every day.  Otherwise, there have been no changes to her past medical history, surgical history, family history, or social history  She did end up coming by the clinic and doing a rapid COVID in the parking lot.  This was negative.  Assessment and Plan:  Saralyn is a 40 y.o. female with:  Chronic urticaria - improved on Xolair every 2 weeks   Seasonal and perennial allergic rhinitis (trees, weeds, indoor molds, dust mites and cockroach)   Moderate persistent asthma, uncomplicated - with improved control due to Xolair    Recurrent infections in the setting of Raynaud's phenomenon   Acute sinusitis - COVID negative today    We are going to start her on Biaxin 500 mg twice daily for 10 days.  Hopefully this will help calm her  inflammatory process and clear of any coexisting infection.  She is to contact us if she needs anything else.  I do not think we need prednisone at this point, but we can certainly add it if her symptoms warrant it.    Diagnostics: None.  Medication List:  Current Outpatient Medications  Medication Sig Dispense Refill   acetaminophen (TYLENOL) 500 MG tablet Take 1,000 mg by mouth every 6 (six) hours as needed (for pain.).     albuterol (VENTOLIN HFA)  108 (90 Base) MCG/ACT inhaler Inhale 2 puffs into the lungs every 4 (four) hours as needed for wheezing. 18 g 1   amoxicillin-clavulanate (AUGMENTIN) 875-125 MG tablet Take 1 tablet by mouth 2 (two) times daily. 20 tablet 0   budesonide-formoterol (SYMBICORT) 160-4.5 MCG/ACT inhaler Inhale 2 puffs into lungs BID 1 Inhaler 0   cetirizine (ZYRTEC) 10 MG tablet Take 10 mg by mouth at bedtime.      dicyclomine (BENTYL) 10 MG capsule TAKE 1 CAPSULE BEFORE MEALS AND 1 AT BEDTIME. 180 capsule 3   diphenhydrAMINE (BENADRYL) 25 mg capsule Take 25 mg by mouth every 6 (six) hours as needed (hives).      doxycycline (VIBRA-TABS) 100 MG tablet Take 1 tablet (100 mg total) by mouth 2 (two) times daily. 14 tablet 0   famotidine (PEPCID) 40 MG tablet Take 1 tablet (40 mg total) by mouth daily. 30 tablet 5   fexofenadine (ALLEGRA) 180 MG tablet Take 180 mg by mouth daily.     fluconazole (DIFLUCAN) 150 MG tablet Take 1 tablet p.o. prn vaginal itching. May repeat in 7 days. 2 tablet 0   fluticasone (FLONASE) 50 MCG/ACT nasal spray Place 1 spray into both nostrils daily. 16 g 5   guaiFENesin (MUCINEX) 600 MG 12 hr tablet Take by mouth 2 (two) times daily as needed.     HYDROcodone bit-homatropine (HYCODAN) 5-1.5 MG/5ML syrup Take 5 mLs by mouth every 8 (eight) hours as needed for cough. Drowsiness precautions 90 mL 0   hydrocortisone 2.5 % cream One bid to rash (Patient taking differently: as needed. One bid to rash) 60 g 2   hydrOXYzine (ATARAX/VISTARIL) 25 MG tablet Take 1-2 tab p.o. for insomnia. 30 tablet 0   montelukast (SINGULAIR) 10 MG tablet TAKE 1 TABLET(10 MG) BY MOUTH AT BEDTIME 30 tablet 5   ondansetron (ZOFRAN) 8 MG tablet Take 8 mg by mouth 3 (three) times daily.     predniSONE (DELTASONE) 20 MG tablet 3 po qd x 3 d then 2 po qd x 3 d then 1 po qd x 2 d 17 tablet 0   SYMBICORT 80-4.5 MCG/ACT inhaler Inhale 2 puffs into the lungs 2 (two) times daily. 10.2 g 5   Current Facility-Administered Medications   Medication Dose Route Frequency Provider Last Rate Last Admin   omalizumab Arvid Right) prefilled syringe 300 mg  300 mg Subcutaneous Q14 Days Valentina Shaggy, MD   300 mg at 11/27/20 1137   Allergies: No Known Allergies I reviewed her past medical history, social history, family history, and environmental history and no significant changes have been reported from previous visits.  Review of Systems  Constitutional:  Negative for activity change and appetite change.  HENT:  Positive for rhinorrhea, sinus pressure, sinus pain and sore throat. Negative for congestion and postnasal drip.   Eyes:  Negative for pain, discharge, redness and itching.  Respiratory:  Negative for shortness of breath, wheezing and stridor.   Gastrointestinal:  Negative for diarrhea, nausea and  vomiting.  Musculoskeletal:  Negative for arthralgias, joint swelling and myalgias.  Skin:  Negative for rash.  Allergic/Immunologic: Negative for environmental allergies and food allergies.   Objective:  Physical exam not obtained as encounter was done via telephone.   Previous notes and tests were reviewed.  I discussed the assessment and treatment plan with the patient. The patient was provided an opportunity to ask questions and all were answered. The patient agreed with the plan and demonstrated an understanding of the instructions.   The patient was advised to call back or seek an in-person evaluation if the symptoms worsen or if the condition fails to improve as anticipated.  I provided 16 minutes of non-face-to-face time during this encounter.  It was my pleasure to participate in [place] care today. Please feel free to contact me with any questions or concerns.   Sincerely,  Valentina Shaggy, MD

## 2020-12-12 ENCOUNTER — Encounter: Payer: Self-pay | Admitting: Allergy & Immunology

## 2020-12-12 MED ORDER — CLARITHROMYCIN 500 MG PO TABS: 500.0000 mg | ORAL_TABLET | Freq: Two times a day (BID) | ORAL | 0 refills | Status: AC

## 2020-12-13 ENCOUNTER — Other Ambulatory Visit: Payer: Self-pay

## 2020-12-13 ENCOUNTER — Ambulatory Visit (INDEPENDENT_AMBULATORY_CARE_PROVIDER_SITE_OTHER): Payer: BC Managed Care – PPO

## 2020-12-13 DIAGNOSIS — L501 Idiopathic urticaria: Secondary | ICD-10-CM | POA: Diagnosis not present

## 2020-12-27 ENCOUNTER — Ambulatory Visit (INDEPENDENT_AMBULATORY_CARE_PROVIDER_SITE_OTHER): Payer: BC Managed Care – PPO

## 2020-12-27 ENCOUNTER — Other Ambulatory Visit: Payer: Self-pay

## 2020-12-27 DIAGNOSIS — L501 Idiopathic urticaria: Secondary | ICD-10-CM | POA: Diagnosis not present

## 2021-01-02 ENCOUNTER — Encounter: Payer: Self-pay | Admitting: Allergy & Immunology

## 2021-01-03 MED ORDER — NIRMATRELVIR/RITONAVIR (PAXLOVID)TABLET: 3.0000 | ORAL_TABLET | Freq: Two times a day (BID) | ORAL | 0 refills | Status: AC

## 2021-01-03 NOTE — Telephone Encounter (Signed)
Patient called to see if she could get an appointment. Unfortunately I did not see any available appointments in any of our offices for in person or televisit. Patient states she was exposed to COVID through her parents, brother and nephew. Patient was coughing while speaking to me, patient also has had a fever, sore throat, runny nose body & body aches. Patient tested today and had a negative result. Patient has been using cough medicine, mucinex, tylenol and ibuprofen. She did state she is having to use her inhalers more often than usual. Patient would like to know if anything could be sent in or what else can be done to help her.   Please advise   [facility] - [address], [place]  Best contact number: [phone]

## 2021-01-10 ENCOUNTER — Other Ambulatory Visit: Payer: Self-pay

## 2021-01-10 ENCOUNTER — Ambulatory Visit (INDEPENDENT_AMBULATORY_CARE_PROVIDER_SITE_OTHER): Payer: BC Managed Care – PPO

## 2021-01-10 DIAGNOSIS — L501 Idiopathic urticaria: Secondary | ICD-10-CM

## 2021-01-24 ENCOUNTER — Other Ambulatory Visit: Payer: Self-pay

## 2021-01-24 ENCOUNTER — Ambulatory Visit (INDEPENDENT_AMBULATORY_CARE_PROVIDER_SITE_OTHER): Payer: BC Managed Care – PPO

## 2021-01-24 DIAGNOSIS — L501 Idiopathic urticaria: Secondary | ICD-10-CM

## 2021-02-07 ENCOUNTER — Other Ambulatory Visit: Payer: Self-pay

## 2021-02-07 ENCOUNTER — Ambulatory Visit (INDEPENDENT_AMBULATORY_CARE_PROVIDER_SITE_OTHER): Payer: BC Managed Care – PPO

## 2021-02-07 DIAGNOSIS — L501 Idiopathic urticaria: Secondary | ICD-10-CM

## 2021-02-21 ENCOUNTER — Ambulatory Visit (INDEPENDENT_AMBULATORY_CARE_PROVIDER_SITE_OTHER): Payer: BC Managed Care – PPO

## 2021-02-21 ENCOUNTER — Other Ambulatory Visit: Payer: Self-pay

## 2021-02-21 DIAGNOSIS — L501 Idiopathic urticaria: Secondary | ICD-10-CM | POA: Diagnosis not present

## 2021-03-05 ENCOUNTER — Encounter: Payer: Self-pay | Admitting: Family Medicine

## 2021-03-05 ENCOUNTER — Other Ambulatory Visit: Payer: Self-pay

## 2021-03-05 ENCOUNTER — Ambulatory Visit (INDEPENDENT_AMBULATORY_CARE_PROVIDER_SITE_OTHER): Payer: BC Managed Care – PPO | Admitting: Family Medicine

## 2021-03-05 VITALS — HR 94 | Temp 98.7°F | Ht 67.0 in | Wt 267.0 lb

## 2021-03-05 DIAGNOSIS — R6889 Other general symptoms and signs: Secondary | ICD-10-CM

## 2021-03-05 DIAGNOSIS — R059 Cough, unspecified: Secondary | ICD-10-CM

## 2021-03-05 NOTE — Progress Notes (Signed)
° °  Subjective:    Patient ID: Amy Russell, female    DOB: 05-13-1980, 40 y.o.   MRN: [id number]  HPI Cough, body aches, sore throat , head ache,  fever yesterday 101 OTC delsyum, mucinex, Ibuprofen, negative home test for covid, son had covid last week   Head congestion drainage coughing sore throat not feeling good past couple days son had COVID recently Review of Systems     Objective:   Physical Exam Gen-NAD not toxic TMS-normal bilateral T- normal no redness Chest-CTA respiratory rate normal no crackles CV RRR no murmur Skin-warm dry Neuro-grossly normal        Assessment & Plan:  Flulike illness Triple swab taken Supportive measures discussed Prescription for antibiotics written and given to the patient if she should get worse over the weekend Parameters for filling the medicine was discussed Follow-up if ongoing troubles

## 2021-03-06 LAB — COVID-19, FLU A+B AND RSV
Influenza A, NAA: NOT DETECTED
Influenza B, NAA: NOT DETECTED
RSV, NAA: NOT DETECTED
SARS-CoV-2, NAA: NOT DETECTED

## 2021-03-14 ENCOUNTER — Ambulatory Visit (INDEPENDENT_AMBULATORY_CARE_PROVIDER_SITE_OTHER): Payer: BC Managed Care – PPO

## 2021-03-14 ENCOUNTER — Other Ambulatory Visit: Payer: Self-pay

## 2021-03-14 DIAGNOSIS — L501 Idiopathic urticaria: Secondary | ICD-10-CM

## 2021-03-28 ENCOUNTER — Ambulatory Visit (INDEPENDENT_AMBULATORY_CARE_PROVIDER_SITE_OTHER): Payer: BC Managed Care – PPO

## 2021-03-28 ENCOUNTER — Other Ambulatory Visit: Payer: Self-pay

## 2021-03-28 DIAGNOSIS — L501 Idiopathic urticaria: Secondary | ICD-10-CM | POA: Diagnosis not present

## 2021-04-11 ENCOUNTER — Other Ambulatory Visit: Payer: Self-pay

## 2021-04-11 ENCOUNTER — Ambulatory Visit (INDEPENDENT_AMBULATORY_CARE_PROVIDER_SITE_OTHER): Payer: BC Managed Care – PPO

## 2021-04-11 DIAGNOSIS — L501 Idiopathic urticaria: Secondary | ICD-10-CM | POA: Diagnosis not present

## 2021-04-25 ENCOUNTER — Ambulatory Visit: Payer: BC Managed Care – PPO

## 2021-05-09 ENCOUNTER — Other Ambulatory Visit: Payer: Self-pay

## 2021-05-09 ENCOUNTER — Ambulatory Visit (INDEPENDENT_AMBULATORY_CARE_PROVIDER_SITE_OTHER): Payer: BC Managed Care – PPO

## 2021-05-09 DIAGNOSIS — L501 Idiopathic urticaria: Secondary | ICD-10-CM

## 2021-05-28 ENCOUNTER — Ambulatory Visit: Payer: BC Managed Care – PPO

## 2021-06-20 ENCOUNTER — Other Ambulatory Visit: Payer: Self-pay | Admitting: Allergy & Immunology

## 2021-07-02 ENCOUNTER — Other Ambulatory Visit: Payer: Self-pay | Admitting: Allergy & Immunology

## 2021-07-02 NOTE — Telephone Encounter (Signed)
Called and left a message for patient to call our office to schedule a follow up appointment with a provider to ensure she can continue receiving refills.  ?

## 2021-07-28 ENCOUNTER — Encounter: Payer: Self-pay | Admitting: Family Medicine

## 2021-07-29 MED ORDER — ONDANSETRON 4 MG PO TBDP: 4.0000 mg | ORAL_TABLET | Freq: Three times a day (TID) | ORAL | 0 refills | Status: DC | PRN

## 2021-07-29 NOTE — Telephone Encounter (Signed)
If needing Zofran please let us know  May use Tylenol for fever  (Obviously if having emergency symptoms go to ER otherwise keep follow-up visit later this afternoon)

## 2021-07-29 NOTE — Telephone Encounter (Signed)
Zofran 4 mg ODT, #15, 1 taken every 8 hours as needed for nausea

## 2021-08-04 ENCOUNTER — Encounter: Payer: Self-pay | Admitting: Family Medicine

## 2021-08-06 NOTE — Telephone Encounter (Signed)
Telephone message taken in patient's chart and sent to provider

## 2021-08-20 ENCOUNTER — Encounter: Payer: Self-pay | Admitting: Family Medicine

## 2021-08-21 ENCOUNTER — Ambulatory Visit: Payer: BC Managed Care – PPO | Admitting: Nurse Practitioner

## 2021-08-21 ENCOUNTER — Encounter: Payer: Self-pay | Admitting: Nurse Practitioner

## 2021-08-21 VITALS — BP 129/82 | HR 82 | Temp 97.3°F | Ht 67.0 in | Wt 267.0 lb

## 2021-08-21 DIAGNOSIS — R300 Dysuria: Secondary | ICD-10-CM | POA: Diagnosis not present

## 2021-08-21 DIAGNOSIS — J45909 Unspecified asthma, uncomplicated: Secondary | ICD-10-CM

## 2021-08-21 LAB — POCT URINALYSIS DIP (MANUAL ENTRY)
Bilirubin, UA: NEGATIVE
Glucose, UA: NEGATIVE mg/dL
Ketones, POC UA: NEGATIVE mg/dL
Nitrite, UA: NEGATIVE
Protein Ur, POC: NEGATIVE mg/dL
Spec Grav, UA: 1.015 (ref 1.010–1.025)
Urobilinogen, UA: 0.2 E.U./dL
pH, UA: 5 (ref 5.0–8.0)

## 2021-08-21 MED ORDER — SULFAMETHOXAZOLE-TRIMETHOPRIM 800-160 MG PO TABS
1.0000 | ORAL_TABLET | Freq: Two times a day (BID) | ORAL | 0 refills | Status: AC
Start: 2021-08-21 — End: 2021-08-24

## 2021-08-21 MED ORDER — MONTELUKAST SODIUM 10 MG PO TABS: 10.0000 mg | ORAL_TABLET | Freq: Every day | ORAL | 0 refills | Status: DC

## 2021-08-21 NOTE — Progress Notes (Signed)
   Subjective:    Patient ID: Amy Russell, female    DOB: 01/28/1981, 41 y.o.   MRN: [id number]  Dysuria  This is a new problem.   Urinary frequency, pain after emptying bladder as well as pressure, back pain x4 to 5 days.  Patient denies any fevers, body aches, chills.  Patient states that she is going to the beach this weekend and would like to be well to enjoy her vacation.   Review of Systems  Genitourinary:  Positive for dysuria.       Objective:   Physical Exam Vitals reviewed.  Constitutional:      General: She is not in acute distress.    Appearance: Normal appearance. She is obese. She is not ill-appearing, toxic-appearing or diaphoretic.  HENT:     Head: Normocephalic and atraumatic.  Cardiovascular:     Rate and Rhythm: Normal rate and regular rhythm.     Pulses: Normal pulses.     Heart sounds: Normal heart sounds. No murmur heard. Pulmonary:     Effort: Pulmonary effort is normal. No respiratory distress.     Breath sounds: Normal breath sounds. No wheezing.  Abdominal:     General: Abdomen is flat. Bowel sounds are normal. There is no distension.     Palpations: Abdomen is soft. There is no mass.     Tenderness: There is no abdominal tenderness. There is left CVA tenderness. There is no right CVA tenderness, guarding or rebound.     Hernia: No hernia is present.  Skin:    General: Skin is warm.     Capillary Refill: Capillary refill takes less than 2 seconds.  Neurological:     Mental Status: She is alert.  Psychiatric:        Mood and Affect: Mood normal.        Behavior: Behavior normal.           Assessment & Plan:   1. Dysuria -Likely UTI -Point-of-care urinalysis positive for leukocytes. -Since patient is going to the beach will cover with broad-spectrum antibiotic for 3 days. -Culture pending - POCT urinalysis dipstick - sulfamethoxazole-trimethoprim (BACTRIM DS) 800-160 MG tablet; Take 1 tablet by mouth 2 (two) times daily for 3  days.  Dispense: 6 tablet; Refill: 0 -Patient instructed to go to urgent care if not feeling better within 1 to 2 days of taking antibiotics. -If patient does need to go to urgent care while on vacation follow-up with clinic with PCP when she returns.  Patient stated understanding  2. Asthma due to seasonal allergies - montelukast (SINGULAIR) 10 MG tablet; Take 1 tablet (10 mg total) by mouth at bedtime.  Dispense: 90 tablet; Refill: 0    Note:  This document was prepared using Dragon voice recognition software and may include unintentional dictation errors. Note - This record has been created using [employer].  Chart creation errors have been sought, but may not always  have been located. Such creation errors do not reflect on  the standard of medical care.

## 2021-08-28 LAB — URINE CULTURE

## 2021-10-03 ENCOUNTER — Encounter: Payer: Self-pay | Admitting: Family Medicine

## 2021-11-26 ENCOUNTER — Other Ambulatory Visit: Payer: Self-pay | Admitting: Nurse Practitioner

## 2021-11-26 DIAGNOSIS — J45909 Unspecified asthma, uncomplicated: Secondary | ICD-10-CM

## 2022-02-03 ENCOUNTER — Ambulatory Visit (INDEPENDENT_AMBULATORY_CARE_PROVIDER_SITE_OTHER): Payer: BC Managed Care – PPO | Admitting: Family Medicine

## 2022-02-03 VITALS — BP 142/90 | Temp 97.2°F | Ht 67.0 in | Wt 267.2 lb

## 2022-02-03 DIAGNOSIS — R051 Acute cough: Secondary | ICD-10-CM | POA: Diagnosis not present

## 2022-02-03 DIAGNOSIS — B349 Viral infection, unspecified: Secondary | ICD-10-CM | POA: Diagnosis not present

## 2022-02-03 MED ORDER — PREDNISONE 50 MG PO TABS: ORAL_TABLET | ORAL | 0 refills | Status: DC

## 2022-02-03 MED ORDER — PROMETHAZINE-DM 6.25-15 MG/5ML PO SYRP: 5.0000 mL | ORAL_SOLUTION | Freq: Four times a day (QID) | ORAL | 0 refills | Status: DC | PRN

## 2022-02-03 NOTE — Patient Instructions (Signed)
Medication as prescribed.  Lots of fluids and rest.  Take care  Dr. Adriana Simas

## 2022-02-04 DIAGNOSIS — B349 Viral infection, unspecified: Secondary | ICD-10-CM | POA: Insufficient documentation

## 2022-02-04 NOTE — Progress Notes (Signed)
Subjective:  Patient ID: Amy Russell, female    DOB: [birth year]  Age: 41 y.o. MRN: [id number]  CC: Chief Complaint  Patient presents with   Cough    Congestion, headache, body aches, fever and sore throat since yesterday    HPI:  41 year old female presents for evaluation of the above.  Symptoms started yesterday.  She reports cough, headache, congestion, subjective fever, and sore throat.  She is most bothered by the cough.  Currently afebrile.  No relieving factors.  No other complaints or concerns at this time.  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 2 KIDS: AGE 21, 7. DIVORCED.   Social Determinants of Health   Financial Resource Strain: Not on file  Food Insecurity: Not on file  Transportation Needs: Not on file  Physical Activity: Not on file  Stress: Not on file  Social Connections: Not on file    Review of Systems Per HPI  Objective:  BP (!) 142/90   Temp (!) 97.2 F (36.2 C) (Oral)   Ht 5\' 7"  (1.702 m)   Wt 267 lb 3.2 oz (121.2 kg)   BMI 41.85 kg/m      02/03/2022    4:24 PM 02/03/2022    4:18 PM 08/21/2021    1:06 PM  BP/Weight  Systolic BP A999333 [id number] [id number]   Diastolic BP 90 90 82  Wt. (Lbs)  267.2 267  BMI  41.85 kg/m2 41.82 kg/m2    Physical Exam Vitals and nursing note reviewed.  Constitutional:      Appearance: Normal appearance. She is obese.  HENT:     Head: Normocephalic and atraumatic.     Right Ear: Tympanic membrane normal.     Left Ear: Tympanic membrane normal.     Mouth/Throat:     Pharynx: Oropharynx is clear. No oropharyngeal exudate or posterior oropharyngeal erythema.  Eyes:     General:        Right eye: No discharge.        Left eye: No discharge.     Conjunctiva/sclera: Conjunctivae normal.  Cardiovascular:     Rate and Rhythm: Normal rate and regular rhythm.  Pulmonary:     Effort: Pulmonary effort is normal.     Breath sounds: Normal breath sounds. No wheezing or rales.  Neurological:     Mental Status: She is alert.  Psychiatric:        Mood and Affect: Mood normal.        Behavior: Behavior normal.     Lab Results  Component Value Date   WBC 7.6 10/18/2018  HGB 12.7 10/18/2018   HCT 38.8 10/18/2018   PLT 269 10/18/2018   GLUCOSE 103 (H) 10/18/2018   ALT 18 10/18/2018   AST 15 10/18/2018   NA 138 10/18/2018   K 3.9 10/18/2018   CL 106 10/18/2018   CREATININE 0.80 10/18/2018   BUN 17 10/18/2018   CO2 23 10/18/2018   TSH 2.23 10/18/2018     Assessment & Plan:   Problem List Items Addressed This Visit       Other   Viral illness - Primary    This is likely viral in origin given the acute onset and unrevealing clinical exam.  Awaiting COVID, flu, RSV testing.  Given patient's history of asthma, placing on prednisone.  Promethazine DM for cough.      Relevant Orders   COVID-19, Flu A+B and RSV   Other Visit Diagnoses     Acute cough       Relevant Orders   COVID-19, Flu A+B and RSV       Meds ordered this encounter  Medications   predniSONE (DELTASONE) 50 MG tablet    Sig: 1 tablet daily x 5 days    Dispense:  5 tablet    Refill:  0   promethazine-dextromethorphan  (PROMETHAZINE-DM) 6.25-15 MG/5ML syrup    Sig: Take 5 mLs by mouth 4 (four) times daily as needed for cough.    Dispense:  118 mL    Refill:  0    Follow-up:  Return if symptoms worsen or fail to improve.  Everlene Other DO [facility]

## 2022-02-04 NOTE — Assessment & Plan Note (Signed)
This is likely viral in origin given the acute onset and unrevealing clinical exam.  Awaiting COVID, flu, RSV testing.  Given patient's history of asthma, placing on prednisone.  Promethazine DM for cough.

## 2022-02-05 LAB — COVID-19, FLU A+B AND RSV
Influenza A, NAA: NOT DETECTED
Influenza B, NAA: NOT DETECTED
RSV, NAA: NOT DETECTED
SARS-CoV-2, NAA: NOT DETECTED

## 2022-02-05 LAB — SPECIMEN STATUS REPORT

## 2022-03-25 ENCOUNTER — Other Ambulatory Visit: Payer: Self-pay

## 2022-03-25 DIAGNOSIS — J45909 Unspecified asthma, uncomplicated: Secondary | ICD-10-CM

## 2022-03-25 MED ORDER — MONTELUKAST SODIUM 10 MG PO TABS: ORAL_TABLET | ORAL | 0 refills | Status: DC

## 2022-04-06 ENCOUNTER — Encounter: Payer: Self-pay | Admitting: Family Medicine

## 2022-11-27 ENCOUNTER — Encounter: Payer: Self-pay | Admitting: Family Medicine

## 2022-11-27 ENCOUNTER — Other Ambulatory Visit: Payer: Self-pay | Admitting: Family Medicine

## 2023-01-31 ENCOUNTER — Encounter: Payer: Self-pay | Admitting: Family Medicine

## 2023-02-09 DIAGNOSIS — N6459 Other signs and symptoms in breast: Secondary | ICD-10-CM | POA: Diagnosis not present

## 2023-02-09 DIAGNOSIS — L732 Hidradenitis suppurativa: Secondary | ICD-10-CM | POA: Diagnosis not present

## 2023-02-11 ENCOUNTER — Other Ambulatory Visit: Payer: Self-pay | Admitting: Obstetrics and Gynecology

## 2023-02-11 DIAGNOSIS — N644 Mastodynia: Secondary | ICD-10-CM

## 2023-02-23 ENCOUNTER — Ambulatory Visit
Admission: RE | Admit: 2023-02-23 | Discharge: 2023-02-23 | Disposition: A | Discharge status: Home / Self Care | Payer: BC Managed Care – PPO | Source: Ambulatory Visit | Attending: Obstetrics and Gynecology | Admitting: Obstetrics and Gynecology

## 2023-02-23 ENCOUNTER — Ambulatory Visit: Payer: BC Managed Care – PPO

## 2023-02-23 DIAGNOSIS — N644 Mastodynia: Secondary | ICD-10-CM

## 2023-02-23 DIAGNOSIS — N6459 Other signs and symptoms in breast: Secondary | ICD-10-CM | POA: Diagnosis not present

## 2023-04-15 ENCOUNTER — Encounter: Payer: Self-pay | Admitting: Family Medicine

## 2023-04-15 NOTE — Telephone Encounter (Signed)
 Nurses I sent a response via telephone message regarding Amy Russell Under current [facility]/healthcare policy I am not lateral to respond regarding another patient's situation under a different chart even if it is a family member  Please see telephone message and  talk with mom thanks

## 2023-04-21 ENCOUNTER — Encounter: Payer: Self-pay | Admitting: Family Medicine

## 2023-04-21 ENCOUNTER — Ambulatory Visit: Payer: BC Managed Care – PPO | Admitting: Family Medicine

## 2023-04-21 VITALS — BP 146/88 | HR 71 | Temp 97.8°F | Ht 67.0 in | Wt 270.0 lb

## 2023-04-21 DIAGNOSIS — J019 Acute sinusitis, unspecified: Secondary | ICD-10-CM

## 2023-04-21 MED ORDER — PREDNISONE 20 MG PO TABS: ORAL_TABLET | ORAL | 0 refills | Status: DC

## 2023-04-21 MED ORDER — ALBUTEROL SULFATE HFA 108 (90 BASE) MCG/ACT IN AERS: 2.0000 | INHALATION_SPRAY | RESPIRATORY_TRACT | 2 refills | Status: DC | PRN

## 2023-04-21 MED ORDER — AMOXICILLIN-POT CLAVULANATE 875-125 MG PO TABS: 1.0000 | ORAL_TABLET | Freq: Two times a day (BID) | ORAL | 0 refills | Status: DC

## 2023-04-21 NOTE — Progress Notes (Signed)
   Subjective:    Patient ID: Amy Russell, female    DOB: 1980-12-31, 43 y.o.   MRN: [id number]  HPI   Patient presents today with respiratory illness Number of days present-7 or 8 days head congestion sinus pressure not feeling good  Symptoms include-head congestion sinus pressure drainage coughing body aches low-grade fever started off flulike illness but now persistent sinus pressure pain  Presence of worrisome signs (severe shortness of breath, lethargy, etc.) -no wheezing or difficulty breathing  Recent/current visit to urgent care or ER-none  Recent direct exposure to Covid-none  Any current Covid testing-none   Review of Systems     Objective:   Physical Exam  Gen-NAD not toxic TMS-normal bilateral T- normal no redness Chest-CTA respiratory rate normal no crackles CV RRR no murmur Skin-warm dry Neuro-grossly normal Moderate sinus tenderness      Assessment & Plan:  Acute rhinosinusitis Recent flulike illness No sign of pneumonia Warning signs discussed follow-up if problems May use albuterol as needed If progressive symptoms are worse follow-up

## 2023-05-07 ENCOUNTER — Encounter: Payer: Self-pay | Admitting: Family Medicine

## 2023-05-07 ENCOUNTER — Other Ambulatory Visit: Payer: Self-pay | Admitting: Nurse Practitioner

## 2023-05-07 MED ORDER — FLUCONAZOLE 150 MG PO TABS: ORAL_TABLET | ORAL | 0 refills | Status: DC

## 2023-11-10 ENCOUNTER — Encounter: Payer: Self-pay | Admitting: Family Medicine

## 2023-11-16 NOTE — Telephone Encounter (Signed)
 Nurses Although this could still be symptoms from the virus there is also possibility this could be sinus infection Please fit her in tomorrow into my schedule we can evaluate this accordingly thank you

## 2023-11-17 ENCOUNTER — Ambulatory Visit: Admitting: Family Medicine

## 2023-11-17 ENCOUNTER — Encounter: Payer: Self-pay | Admitting: Family Medicine

## 2023-11-17 VITALS — BP 136/86 | HR 104 | Temp 97.7°F | Ht 67.0 in | Wt 270.0 lb

## 2023-11-17 DIAGNOSIS — J019 Acute sinusitis, unspecified: Secondary | ICD-10-CM

## 2023-11-17 MED ORDER — PREDNISONE 20 MG PO TABS: ORAL_TABLET | ORAL | 0 refills | Status: DC

## 2023-11-17 MED ORDER — AMOXICILLIN-POT CLAVULANATE 875-125 MG PO TABS: 1.0000 | ORAL_TABLET | Freq: Two times a day (BID) | ORAL | 0 refills | Status: DC

## 2023-11-17 MED ORDER — BENZONATATE 100 MG PO CAPS: 100.0000 mg | ORAL_CAPSULE | Freq: Three times a day (TID) | ORAL | 1 refills | Status: AC | PRN

## 2023-11-17 MED ORDER — ALBUTEROL SULFATE HFA 108 (90 BASE) MCG/ACT IN AERS: 2.0000 | INHALATION_SPRAY | RESPIRATORY_TRACT | 2 refills | Status: AC | PRN

## 2023-11-17 NOTE — Progress Notes (Signed)
   Subjective:    Patient ID: Amy Russell, female    DOB: 06-16-1980, 43 y.o.   MRN: [id number]  HPI  Non productive cough, sinus headache x's 10 days  Been sick for over 10 days started off with cough sore throat now having some intermittent wheezing denies high fever chills relates some nausea no vomiting. Review of Systems     Objective:   Physical Exam Gen-NAD not toxic TMS-normal bilateral T- normal no redness Chest-CTA respiratory rate normal no crackles CV RRR no murmur Skin-warm dry Neuro-grossly normal I do not find any evidence of pneumonia.       Assessment & Plan:  Viral syndrome Secondary rhinosinusitis Bronchitis related to the virus Supportive measures Prednisone  just in case it needs it Antibiotics prescribed Warning signs discussed albuterol  as needed

## 2023-11-29 ENCOUNTER — Encounter: Payer: Self-pay | Admitting: Family Medicine

## 2023-11-29 ENCOUNTER — Other Ambulatory Visit: Payer: Self-pay | Admitting: Family Medicine

## 2023-11-29 NOTE — Telephone Encounter (Signed)
 Nurses Please talk with Amy Russell-if she feels that her symptoms are getting worse and she would like to be seen please help set this up for this week  If she is feeling short winded or feeling like she cannot breathe well she needs to be seen  If it is more of a matter of she just feels she has not cleared this we can send in an additional prescription of antibiotics  Please clarify with patient

## 2023-12-01 ENCOUNTER — Encounter: Payer: Self-pay | Admitting: Family Medicine

## 2023-12-01 ENCOUNTER — Ambulatory Visit: Admitting: Family Medicine

## 2023-12-01 VITALS — BP 133/84 | HR 84 | Ht 67.0 in | Wt 273.0 lb

## 2023-12-01 DIAGNOSIS — J40 Bronchitis, not specified as acute or chronic: Secondary | ICD-10-CM

## 2023-12-01 MED ORDER — AZITHROMYCIN 250 MG PO TABS: ORAL_TABLET | ORAL | 0 refills | Status: AC

## 2023-12-01 MED ORDER — FLUCONAZOLE 150 MG PO TABS: 150.0000 mg | ORAL_TABLET | Freq: Once | ORAL | 0 refills | Status: AC

## 2023-12-01 MED ORDER — PROMETHAZINE-DM 6.25-15 MG/5ML PO SYRP: 5.0000 mL | ORAL_SOLUTION | Freq: Four times a day (QID) | ORAL | 0 refills | Status: AC | PRN

## 2023-12-01 NOTE — Assessment & Plan Note (Signed)
Treating with azithromycin.  Promethazine DM for cough.

## 2023-12-01 NOTE — Progress Notes (Signed)
 Subjective:  Patient ID: Amy Russell, female    DOB: 09-21-1980  Age: 43 y.o. MRN: [id number]  CC:   Chief Complaint  Patient presents with   Cough    HPI:  43 year old female presents with ongoing cough.  Patient recently treated for sinusitis.  She states that she continues to feel poorly.  She is having dry cough.  She has had some posttussive emesis.  Some associated shortness of breath.  No recent fever.  She has had recent antibiotic use as well as corticosteroid use without significant improvement.  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 2 KIDS: AGE 63, 7. DIVORCED.   Social Drivers of Corporate investment banker Strain: Not on file  Food Insecurity: Not on file  Transportation Needs: Not on file  Physical Activity: Not on file  Stress: Not on file  Social Connections: Not on file    Review of Systems Per HPI  Objective:  BP 133/84   Pulse 84   Ht 5' 7 (1.702 m)   Wt 273 lb (123.8 kg)   SpO2 99%   BMI 42.76 kg/m      12/01/2023   10:55 AM 11/17/2023    8:50 PM 11/17/2023    3:22 PM  BP/Weight  Systolic BP 133 136 140  Diastolic BP 84 86 86  Wt. (Lbs) 273  270  BMI  42.76 kg/m2  42.29 kg/m2    Physical Exam Vitals and nursing note reviewed.  Constitutional:      General: She is not in acute distress.    Appearance: Normal appearance.  HENT:     Head: Normocephalic and atraumatic.  Cardiovascular:     Rate and Rhythm: Normal rate and regular rhythm.  Pulmonary:     Effort: Pulmonary effort is normal.     Breath sounds: Normal breath sounds. No wheezing or rales.  Neurological:     Mental Status: She is alert.     Lab Results  Component Value Date   WBC 7.6 10/18/2018   HGB 12.7 10/18/2018   HCT 38.8 10/18/2018   PLT 269 10/18/2018   GLUCOSE 103 (H) 10/18/2018   ALT 18 10/18/2018   AST 15 10/18/2018   NA 138 10/18/2018   K 3.9 10/18/2018   CL 106 10/18/2018   CREATININE 0.80 10/18/2018   BUN 17 10/18/2018   CO2 23 10/18/2018   TSH 2.23 10/18/2018     Assessment & Plan:  Bronchitis Assessment & Plan: Treating with azithromycin .  Promethazine  DM for cough.   Other orders -     Fluconazole ; Take  1 tablet (150 mg total) by mouth once for 1 dose. Repeat dose in 72 hours.  Dispense: 2 tablet; Refill: 0 -     Azithromycin ; Take 2 tablets on day 1, then 1 tablet daily on days 2 through 5  Dispense: 6 tablet; Refill: 0 -     Promethazine -DM; Take 5 mLs by mouth 4 (four) times daily as needed for cough.  Dispense: 118 mL; Refill: 0    Follow-up:  Return if symptoms worsen or fail to improve.  Jacqulyn Ahle DO [facility]
